# Patient Record
Sex: Female | Born: 1960 | Race: Black or African American | Hispanic: No | Smoking: Never smoker
Health system: Southern US, Community
[De-identification: ages and names within clinical notes are randomized; demographics above are authoritative.]

## PROBLEM LIST (undated history)

## (undated) DIAGNOSIS — M1990 Unspecified osteoarthritis, unspecified site: Secondary | ICD-10-CM

## (undated) DIAGNOSIS — I10 Essential (primary) hypertension: Secondary | ICD-10-CM

## (undated) DIAGNOSIS — R51 Headache: Secondary | ICD-10-CM

## (undated) HISTORY — PX: TUBAL LIGATION: SHX77

## (undated) HISTORY — PX: BUNIONECTOMY: SHX129

## (undated) HISTORY — PX: WISDOM TOOTH EXTRACTION: SHX21

## (undated) HISTORY — PX: LOWER EXTREMITY ANGIOGRAM: SHX5955

## (undated) HISTORY — PX: COLONOSCOPY: SHX174

## (undated) HISTORY — DX: Essential (primary) hypertension: I10

## (undated) HISTORY — PX: POLYPECTOMY: SHX149

## (undated) HISTORY — DX: Headache: R51

---

## 1998-04-12 ENCOUNTER — Encounter: Payer: Self-pay | Admitting: Emergency Medicine

## 1998-04-12 ENCOUNTER — Emergency Department (HOSPITAL_COMMUNITY): Admission: EM | Admit: 1998-04-12 | Discharge: 1998-04-12 | Payer: Self-pay | Admitting: Emergency Medicine

## 1999-09-20 ENCOUNTER — Emergency Department (HOSPITAL_COMMUNITY): Admission: EM | Admit: 1999-09-20 | Discharge: 1999-09-20 | Payer: Self-pay | Admitting: Emergency Medicine

## 2001-05-06 ENCOUNTER — Ambulatory Visit (HOSPITAL_COMMUNITY): Admission: RE | Admit: 2001-05-06 | Discharge: 2001-05-06 | Payer: Self-pay | Admitting: Family Medicine

## 2001-05-06 ENCOUNTER — Encounter: Payer: Self-pay | Admitting: Family Medicine

## 2002-08-19 ENCOUNTER — Other Ambulatory Visit: Admission: RE | Admit: 2002-08-19 | Discharge: 2002-08-19 | Payer: Self-pay | Admitting: *Deleted

## 2003-12-14 ENCOUNTER — Ambulatory Visit (HOSPITAL_COMMUNITY): Admission: RE | Admit: 2003-12-14 | Discharge: 2003-12-14 | Payer: Self-pay | Admitting: Obstetrics & Gynecology

## 2005-12-04 ENCOUNTER — Other Ambulatory Visit: Admission: RE | Admit: 2005-12-04 | Discharge: 2005-12-04 | Payer: Self-pay | Admitting: Obstetrics and Gynecology

## 2006-03-20 ENCOUNTER — Emergency Department (HOSPITAL_COMMUNITY): Admission: EM | Admit: 2006-03-20 | Discharge: 2006-03-20 | Payer: Self-pay | Admitting: Family Medicine

## 2006-04-29 ENCOUNTER — Ambulatory Visit (HOSPITAL_COMMUNITY): Admission: RE | Admit: 2006-04-29 | Discharge: 2006-04-29 | Payer: Self-pay | Admitting: Otolaryngology

## 2007-03-05 ENCOUNTER — Ambulatory Visit: Payer: Self-pay | Admitting: Internal Medicine

## 2007-03-05 DIAGNOSIS — Z9189 Other specified personal risk factors, not elsewhere classified: Secondary | ICD-10-CM

## 2007-03-05 DIAGNOSIS — H9319 Tinnitus, unspecified ear: Secondary | ICD-10-CM | POA: Insufficient documentation

## 2007-03-05 DIAGNOSIS — R079 Chest pain, unspecified: Secondary | ICD-10-CM

## 2007-03-05 DIAGNOSIS — R51 Headache: Secondary | ICD-10-CM

## 2007-03-05 DIAGNOSIS — R519 Headache, unspecified: Secondary | ICD-10-CM | POA: Insufficient documentation

## 2007-03-06 ENCOUNTER — Telehealth: Payer: Self-pay | Admitting: Internal Medicine

## 2007-06-18 ENCOUNTER — Emergency Department (HOSPITAL_COMMUNITY): Admission: EM | Admit: 2007-06-18 | Discharge: 2007-06-18 | Payer: Self-pay | Admitting: Emergency Medicine

## 2007-08-27 DIAGNOSIS — K219 Gastro-esophageal reflux disease without esophagitis: Secondary | ICD-10-CM

## 2007-08-27 DIAGNOSIS — K319 Disease of stomach and duodenum, unspecified: Secondary | ICD-10-CM | POA: Insufficient documentation

## 2007-08-27 DIAGNOSIS — K449 Diaphragmatic hernia without obstruction or gangrene: Secondary | ICD-10-CM | POA: Insufficient documentation

## 2007-08-27 DIAGNOSIS — K649 Unspecified hemorrhoids: Secondary | ICD-10-CM | POA: Insufficient documentation

## 2007-10-12 ENCOUNTER — Ambulatory Visit: Payer: Self-pay | Admitting: Gastroenterology

## 2007-12-30 ENCOUNTER — Ambulatory Visit: Payer: Self-pay | Admitting: Gastroenterology

## 2007-12-30 DIAGNOSIS — K5909 Other constipation: Secondary | ICD-10-CM

## 2007-12-30 DIAGNOSIS — R1031 Right lower quadrant pain: Secondary | ICD-10-CM | POA: Insufficient documentation

## 2008-01-06 ENCOUNTER — Ambulatory Visit (HOSPITAL_COMMUNITY): Admission: RE | Admit: 2008-01-06 | Discharge: 2008-01-06 | Payer: Self-pay | Admitting: Gastroenterology

## 2008-01-06 ENCOUNTER — Ambulatory Visit: Payer: Self-pay | Admitting: Gastroenterology

## 2008-01-22 LAB — HM MAMMOGRAPHY: HM Mammogram: NORMAL

## 2008-07-19 ENCOUNTER — Ambulatory Visit: Payer: Self-pay | Admitting: Internal Medicine

## 2008-07-19 DIAGNOSIS — M542 Cervicalgia: Secondary | ICD-10-CM | POA: Insufficient documentation

## 2008-08-15 ENCOUNTER — Telehealth (INDEPENDENT_AMBULATORY_CARE_PROVIDER_SITE_OTHER): Payer: Self-pay | Admitting: *Deleted

## 2008-08-16 ENCOUNTER — Encounter: Payer: Self-pay | Admitting: Internal Medicine

## 2008-08-31 ENCOUNTER — Telehealth: Payer: Self-pay | Admitting: Internal Medicine

## 2009-01-27 ENCOUNTER — Ambulatory Visit (HOSPITAL_BASED_OUTPATIENT_CLINIC_OR_DEPARTMENT_OTHER): Admission: RE | Admit: 2009-01-27 | Discharge: 2009-01-27 | Payer: Self-pay | Admitting: Internal Medicine

## 2009-01-27 ENCOUNTER — Ambulatory Visit: Payer: Self-pay | Admitting: Diagnostic Radiology

## 2009-01-27 ENCOUNTER — Telehealth: Payer: Self-pay | Admitting: Family

## 2009-01-27 ENCOUNTER — Ambulatory Visit: Payer: Self-pay | Admitting: Family

## 2009-01-27 DIAGNOSIS — M5380 Other specified dorsopathies, site unspecified: Secondary | ICD-10-CM | POA: Insufficient documentation

## 2009-01-27 DIAGNOSIS — R1084 Generalized abdominal pain: Secondary | ICD-10-CM | POA: Insufficient documentation

## 2009-01-27 LAB — CONVERTED CEMR LAB
Bilirubin Urine: NEGATIVE
Glucose, Urine, Semiquant: NEGATIVE
Ketones, urine, test strip: NEGATIVE
Nitrite: NEGATIVE
Protein, U semiquant: NEGATIVE
Specific Gravity, Urine: 1.01
Urobilinogen, UA: 0.2
WBC Urine, dipstick: NEGATIVE
pH: 7.5

## 2009-02-01 ENCOUNTER — Ambulatory Visit: Payer: Self-pay | Admitting: Internal Medicine

## 2009-02-03 ENCOUNTER — Telehealth (INDEPENDENT_AMBULATORY_CARE_PROVIDER_SITE_OTHER): Payer: Self-pay | Admitting: *Deleted

## 2009-02-06 ENCOUNTER — Encounter (INDEPENDENT_AMBULATORY_CARE_PROVIDER_SITE_OTHER): Payer: Self-pay | Admitting: *Deleted

## 2010-02-18 LAB — CONVERTED CEMR LAB
ALT: 24 units/L (ref 0–35)
Basophils Relative: 0.5 % (ref 0.0–1.0)
Bilirubin, Direct: 0.1 mg/dL (ref 0.0–0.3)
CO2: 26 meq/L (ref 19–32)
Calcium: 9.4 mg/dL (ref 8.4–10.5)
Creatinine, Ser: 0.9 mg/dL (ref 0.4–1.2)
Eosinophils Relative: 1.9 % (ref 0.0–5.0)
GFR calc Af Amer: 87 mL/min
Glucose, Bld: 89 mg/dL (ref 70–99)
Hemoglobin: 13 g/dL (ref 12.0–15.0)
Lymphocytes Relative: 34.1 % (ref 12.0–46.0)
Monocytes Absolute: 0.5 10*3/uL (ref 0.2–0.7)
Neutro Abs: 3.8 10*3/uL (ref 1.4–7.7)
Potassium: 4.3 meq/L (ref 3.5–5.1)
RDW: 12 % (ref 11.5–14.6)
Total Bilirubin: 0.6 mg/dL (ref 0.3–1.2)
Total Protein: 6.8 g/dL (ref 6.0–8.3)
VLDL: 11 mg/dL (ref 0–40)
WBC: 6.7 10*3/uL (ref 4.5–10.5)

## 2010-02-22 NOTE — Letter (Signed)
Summary: Primary Care Consult Scheduled Letter  [facility] [place]  [address] [place], [place] [address]   Phone: [phone]  Fax: [phone]      02/06/2009 MRN: [id number]  Sandhya Sabater-NEAL [address] [place], [place]  [address]    Dear Ms. Barretto-NEAL,    We have scheduled an appointment for you.  At the recommendation of Sandford Craze, FNP, we have scheduled you a consult with Dr. Melvia Heaps with [facility] on 03-01-2009 at 9:45am.  Their address is [address], [address] floor, [place] [address]. The office phone number is [phone].  If this appointment day and time is not convenient for you, please feel free to call the office of the doctor you are being referred to at the number listed above and reschedule the appointment.    It is important for you to keep your scheduled appointments. We are here to make sure you are given good patient care.   Thank you,    Tanique, Patient Care Coordinator [facility]

## 2010-02-22 NOTE — Progress Notes (Signed)
Summary: ct results and referral  Phone Note Outgoing Call   Call placed by: Doristine Devoid,  February 03, 2009 9:08 AM Call placed to: Patient Summary of Call: Please call Patient and let her know that her abdominal CT is negative.  She should follow up if he symptoms do not improve in the next 1-2 weeks.   Follow-up for Phone Call        spoke w/ patient says she is feeling somewhat better still having some pain on her R side but informed patient that otherwise xray was normal.........Marland KitchenDoristine Devoid  February 03, 2009 9:10 AM   Additional Follow-up for Phone Call Additional follow up Details #1::        Please let patient know that I have referred her to GI. Additional Follow-up by: Lemont Fillers FNP,  February 03, 2009 1:08 PM    Additional Follow-up for Phone Call Additional follow up Details #2::    left message on machine .........Marland KitchenDoristine Devoid  February 03, 2009 4:28 PM   spoke w/ patient has GI appt set but says that stomach pain was subsiding.......Marland KitchenDoristine Devoid  February 08, 2009 3:58 PM

## 2010-02-22 NOTE — Progress Notes (Signed)
Summary: xray results  Phone Note Outgoing Call Call back at [phone]   Call placed by: Lemont Fillers FNP,  January 27, 2009 5:30 PM Summary of Call: Left message for patient to return call.   KUB negative for significan constipation.  UA + for blood.  If symptoms persist- may need renal US or CT to rule out stone. Initial call taken by: Lemont Fillers FNP,  January 27, 2009 5:31 PM  Follow-up for Phone Call        spoke w/ patient says she is still w/ abdominal cramping and is concerned about stomach cancer due to family hx so would you like to do CT?...Marland KitchenMarland KitchenDoristine Devoid  January 30, 2009 9:22 AM   Additional Follow-up for Phone Call Additional follow up Details #1::        Called patient- notes that she continues to have LLQ pain, denies fever, denies palpable tenderness in the left, but + tenderness in the right lower quadrant.  Will order CT abdomen and Pelvis to r/o stone, r/o  colitis.  Patient instructed to call if fever over 101 or BRBPR. Additional Follow-up by: Lemont Fillers FNP,  January 30, 2009 9:39 AM

## 2010-02-22 NOTE — Assessment & Plan Note (Signed)
Summary: ALOT OF PAIN IN THE STOMACH//PH   Vital Signs:  Patient profile:   50 year old female Weight:      216.8 pounds Temp:     98.4 degrees F oral BP sitting:   130 / 80  (left arm) CC: some Lower abdominal pain x2 wks  Comments -nausea at nighttime -stomach feels full  -sharp pain running along back    Primary Care Provider:  Willow Ora, MD  CC:  some Lower abdominal pain x2 wks .  History of Present Illness: Lacey Green is a 50 year old female who presents with c/o initially lower abdominal tenderness in low abdomen before the holidays- this was associated with lower back pain.  Lower abdominal pain has resolved, but still having low back pain at times.  Now notes + nausea, occasional stabbing abdominal pain which radiates around her rib cage.  These symptoms have been on for 1 week.    She noted mold on her filter in her home and is concerned that this may be contributing to GI symptoms.  LMP.  Allergies: 1)  ! Pcn  Review of Systems       Denies fever, denies diarrhea- + constipation took natural laxative-  +BM this AM, noted small amount of blood on tissue today after straining, + hx of hemorrhoids   Impression & Recommendations:  Problem # 1:  ABDOMINAL PAIN, GENERALIZED (ICD-789.07) Assessment New I suspect patient has constipation- will check KUB, plan to order miralax daily if confirmed. Orders: KUB (KUB)  Problem # 2:  MUSCLE SPASM, LUMBAR REGION (ICD-724.8) Assessment: New Will give a trial of NSAID, flexeril.  Pt instructed to follow up if these symptoms do not improve. Her updated medication list for this problem includes:    Flexeril 10 Mg Tab (Cyclobenzaprine hcl) .Marland Kitchen... Take one tablet by mouth three times a day    Mobic 7.5 Mg Tabs (Meloxicam) ..... One tablet by mouth daily prn  Complete Medication List: 1)  Flexeril 10 Mg Tab (Cyclobenzaprine hcl) .... Take one tablet by mouth three times a day 2)  Mobic 7.5 Mg Tabs (Meloxicam) .... One tablet  by mouth daily prn  Patient Instructions: 1)  Complete your x-ray today- I will call with the results and further instructions. 2)  Call if worsening abdominal pain or fever. 3)  Call for follow up if back/pain spasm does not improve. Prescriptions: MOBIC 7.5 MG TABS (MELOXICAM) one tablet by mouth daily PRN  #20 x 0   Entered and Authorized by:   Lemont Fillers FNP   Signed by:   Lemont Fillers FNP on 01/27/2009   Method used:   Electronically to        [facility] [phone]* (retail)       [address]       [place], [place]  [address]       Ph: [phone]       Fax: [phone]   RxID:   [id number] FLEXERIL 10 MG TAB (CYCLOBENZAPRINE HCL) Take one tablet by mouth three times a day  #30 x 0   Entered and Authorized by:   Lemont Fillers FNP   Signed by:   Lemont Fillers FNP on 01/27/2009   Method used:   Electronically to        [facility]  [id number]* (retail)       [address]  [place], [place]  [address]       Ph: [phone]       Fax: [phone]   RxID:   [id number]   Laboratory Results   Urine Tests    Routine Urinalysis   Glucose: negative   (Normal Range: Negative) Bilirubin: negative   (Normal Range: Negative) Ketone: negative   (Normal Range: Negative) Spec. Gravity: 1.010   (Normal Range: 1.003-1.035) Blood: large   (Normal Range: Negative) pH: 7.5   (Normal Range: 5.0-8.0) Protein: negative   (Normal Range: Negative) Urobilinogen: 0.2   (Normal Range: 0-1) Nitrite: negative   (Normal Range: Negative) Leukocyte Esterace: negative   (Normal Range: Negative)    Comments: patient on menses.Marland KitchenMarland KitchenDoristine Devoid  January 27, 2009 11:23 AM     Appended Document: ALOT OF PAIN IN THE STOMACH//PH    Clinical Lists Changes  Orders: Added new Service order of UA Dipstick w/o Micro (manual) ([id number]) - Signed      Appended Document: ALOT OF PAIN IN THE  STOMACH//PH gen: awake, alert, NAD cv: s1/s2, RRR resp: BS CTA Bilaterally abd: soft, non-tender, non-distended, normoactive BS

## 2010-04-24 ENCOUNTER — Encounter: Payer: Self-pay | Admitting: Family

## 2010-04-25 ENCOUNTER — Ambulatory Visit (INDEPENDENT_AMBULATORY_CARE_PROVIDER_SITE_OTHER): Payer: PRIVATE HEALTH INSURANCE | Admitting: Family

## 2010-04-25 ENCOUNTER — Other Ambulatory Visit: Payer: Self-pay | Admitting: Family

## 2010-04-25 ENCOUNTER — Other Ambulatory Visit (HOSPITAL_COMMUNITY)
Admission: RE | Admit: 2010-04-25 | Discharge: 2010-04-25 | Disposition: A | Discharge status: Home / Self Care | Payer: PRIVATE HEALTH INSURANCE | Source: Ambulatory Visit | Attending: Family | Admitting: Family

## 2010-04-25 ENCOUNTER — Encounter: Payer: Self-pay | Admitting: Family

## 2010-04-25 DIAGNOSIS — Z136 Encounter for screening for cardiovascular disorders: Secondary | ICD-10-CM

## 2010-04-25 DIAGNOSIS — Z0000 Encounter for general adult medical examination without abnormal findings: Secondary | ICD-10-CM

## 2010-04-25 DIAGNOSIS — M542 Cervicalgia: Secondary | ICD-10-CM

## 2010-04-25 DIAGNOSIS — Z01419 Encounter for gynecological examination (general) (routine) without abnormal findings: Secondary | ICD-10-CM

## 2010-04-25 DIAGNOSIS — R110 Nausea: Secondary | ICD-10-CM | POA: Insufficient documentation

## 2010-04-25 DIAGNOSIS — Z23 Encounter for immunization: Secondary | ICD-10-CM

## 2010-04-25 DIAGNOSIS — R319 Hematuria, unspecified: Secondary | ICD-10-CM

## 2010-04-25 NOTE — Assessment & Plan Note (Signed)
Recommended that she start PPI.  She declines.  Will check LFT's lipase and abdominal ultrasound.

## 2010-04-25 NOTE — Assessment & Plan Note (Signed)
Check x-ray of the c-spine.

## 2010-04-25 NOTE — Progress Notes (Signed)
  Subjective:    Patient ID: Lacey Green, female    DOB: [birth year], 50 y.o.   MRN: [id number]  HPI Lacey Green is a 50 yr old female who presents today for CPX.  Preventative- Diet- some fast food, trying to watch the fat in her diet.  4sodas/week, 2 teas/week.   Exercise-  Weight training/exercise equiptment at home 3days/week 30 minutes.  Walks 3x a week on treadmill. Weight gain- has gained 19 pounds since the fall. Tetanus was august 2002. Pap 2010- normal, mammogram 2010 Periods reg.  LMP 04/17/10  Nausea- denies associated pain,  Pain is worst on an empty stomach.   Uses icey hot to help the epigastric tenderness.  Dull pain in the right arm-    Review of Systems  Constitutional: Positive for unexpected weight change. Negative for fever and chills.  Eyes: Positive for visual disturbance.  Respiratory: Negative for cough and shortness of breath.   Cardiovascular: Positive for palpitations. Negative for chest pain.       Occasional "heart skips a beat."  Gastrointestinal: Positive for nausea.       Chronic nausea  Genitourinary: Positive for hematuria.       + hematuria last month. ? Stone passed.  Had flank pain at that time. + nocturia  Musculoskeletal: Negative for joint swelling.       Some pain on the right side of her neck.  Uses aleve PRN. Occasional leg cramps/toe cramps  Skin: Negative for rash.  Neurological: Positive for numbness.       + numbness right neck, Denies problems with memory  Psychiatric/Behavioral:       Denies anxiety or depression       Objective:   Physical Exam  Constitutional: She is oriented to person, place, and time. She appears well-developed and well-nourished.       Overweight, pleasant AA female, in NAD  HENT:  Head: Normocephalic and atraumatic.  Right Ear: External ear normal.  Left Ear: External ear normal.       R TM was mildly retracted. L TM normal  Eyes: Conjunctivae, EOM and lids are normal. Pupils are  equal, round, and reactive to light. No scleral icterus.  Neck: Neck supple.  Cardiovascular: Normal rate, regular rhythm and normal heart sounds.   Pulmonary/Chest: Effort normal and breath sounds normal. No respiratory distress.  Abdominal: Soft. There is tenderness in the epigastric area.       + tenderness to palpation of the base of the sternum.  Genitourinary: Vagina normal. No breast swelling or tenderness. No tenderness around the vagina. No vaginal discharge found.       Breast exam normal- no palpable masses  Pelvic exam- normal adnexa, no uterine enlargement, no discharge or lesions. Pap smear performed today.   Musculoskeletal:       Some pain in the right shoulder with flexion  Lymphadenopathy:    She has no cervical adenopathy.    She has no axillary adenopathy.       Approximately 1cm right submaxillary LN, nontender and mobile  Neurological: She is alert and oriented to person, place, and time.  Skin: Skin is warm, dry and intact.  Psychiatric: She has a normal mood and affect. Her speech is normal and behavior is normal. Judgment and thought content normal. Cognition and memory are normal.          Assessment & Plan:

## 2010-04-25 NOTE — Assessment & Plan Note (Signed)
Patient counseled on diet and exercise. Pap performed today.  Mammogram to be scheduled.  Tetanus given today.

## 2010-04-25 NOTE — Patient Instructions (Addendum)
Please complete your fasting lab work downstairs. Schedule mammogram, abdominal ultrasound and neck x-ray downstairs. Keep up the good work the exercise.

## 2010-04-26 LAB — HEPATIC FUNCTION PANEL
ALT: 27 U/L (ref 0–35)
AST: 25 U/L (ref 0–37)
Albumin: 4.2 g/dL (ref 3.5–5.2)
Alkaline Phosphatase: 105 U/L (ref 39–117)
Bilirubin, Direct: 0.1 mg/dL (ref 0.0–0.3)
Indirect Bilirubin: 0.2 mg/dL (ref 0.0–0.9)
Total Bilirubin: 0.3 mg/dL (ref 0.3–1.2)
Total Protein: 6.8 g/dL (ref 6.0–8.3)

## 2010-04-26 LAB — URINALYSIS

## 2010-04-26 LAB — BASIC METABOLIC PANEL
BUN: 13 mg/dL (ref 6–23)
CO2: 24 mEq/L (ref 19–32)
Calcium: 9.8 mg/dL (ref 8.4–10.5)
Chloride: 106 mEq/L (ref 96–112)
Creat: 0.93 mg/dL (ref 0.40–1.20)
Glucose, Bld: 87 mg/dL (ref 70–99)

## 2010-04-27 LAB — URINE CULTURE: Organism ID, Bacteria: NO GROWTH

## 2010-04-29 ENCOUNTER — Encounter: Payer: Self-pay | Admitting: Family

## 2010-04-30 ENCOUNTER — Encounter: Payer: Self-pay | Admitting: Family

## 2010-05-02 ENCOUNTER — Ambulatory Visit (HOSPITAL_BASED_OUTPATIENT_CLINIC_OR_DEPARTMENT_OTHER)
Admission: RE | Admit: 2010-05-02 | Discharge: 2010-05-02 | Disposition: A | Discharge status: Home / Self Care | Payer: PRIVATE HEALTH INSURANCE | Source: Ambulatory Visit | Attending: Family | Admitting: Family

## 2010-05-02 ENCOUNTER — Ambulatory Visit (INDEPENDENT_AMBULATORY_CARE_PROVIDER_SITE_OTHER)
Admission: RE | Admit: 2010-05-02 | Discharge: 2010-05-02 | Disposition: A | Discharge status: Home / Self Care | Payer: PRIVATE HEALTH INSURANCE | Source: Ambulatory Visit | Attending: Family | Admitting: Family

## 2010-05-02 ENCOUNTER — Telehealth: Payer: Self-pay | Admitting: Family

## 2010-05-02 DIAGNOSIS — Z1231 Encounter for screening mammogram for malignant neoplasm of breast: Secondary | ICD-10-CM | POA: Insufficient documentation

## 2010-05-02 DIAGNOSIS — R110 Nausea: Secondary | ICD-10-CM

## 2010-05-02 DIAGNOSIS — R209 Unspecified disturbances of skin sensation: Secondary | ICD-10-CM | POA: Insufficient documentation

## 2010-05-02 DIAGNOSIS — M5030 Other cervical disc degeneration, unspecified cervical region: Secondary | ICD-10-CM | POA: Insufficient documentation

## 2010-05-02 DIAGNOSIS — Z0000 Encounter for general adult medical examination without abnormal findings: Secondary | ICD-10-CM

## 2010-05-02 DIAGNOSIS — R1031 Right lower quadrant pain: Secondary | ICD-10-CM | POA: Insufficient documentation

## 2010-05-02 DIAGNOSIS — M542 Cervicalgia: Secondary | ICD-10-CM

## 2010-05-02 NOTE — Telephone Encounter (Signed)
Left message for patient to return my call to discuss C-spine x-ray.

## 2010-05-03 ENCOUNTER — Telehealth: Payer: Self-pay | Admitting: *Deleted

## 2010-05-03 NOTE — Telephone Encounter (Signed)
Pt returned Lacey Green's call re: c-spine xray. Advised pt Lacey Green will call her tomorrow. Pt states she can be reached at [phone] after 3:30 or we can leave a detailed message on her voicemail.

## 2010-05-03 NOTE — Telephone Encounter (Signed)
Pt.notified

## 2010-05-03 NOTE — Telephone Encounter (Signed)
Left message on machine to return my call.  Notes Recorded by Katrinka Blazing. Peggyann Juba, NP on 05/02/2010 at 10:18 AM Pls call pt and let her know that her ultrasound is normal- no evidence of gallbladder disease.

## 2010-05-04 ENCOUNTER — Telehealth: Payer: Self-pay | Admitting: Family

## 2010-05-04 NOTE — Telephone Encounter (Signed)
Please call lab and request add on of lipid panel.  If they are unable to do so- ask pt to come fasting to her apt in may and we will redraw at that time.

## 2010-05-04 NOTE — Telephone Encounter (Signed)
Left detailed message on number listed.  Pt instructed to call us if she has any numbness or weakness in her arms.  Also asked that she come fasting to her next apt so that FLP can be redrawn. Call us back if questions.

## 2010-05-23 ENCOUNTER — Ambulatory Visit: Payer: PRIVATE HEALTH INSURANCE | Admitting: Family

## 2010-10-17 LAB — DIFFERENTIAL
Eosinophils Absolute: 0.1
Eosinophils Relative: 2
Lymphs Abs: 2.1
Monocytes Absolute: 0.6
Monocytes Relative: 9

## 2010-10-17 LAB — POCT I-STAT, CHEM 8
Calcium, Ion: 1.13
Creatinine, Ser: 0.8
Glucose, Bld: 89
Hemoglobin: 12.9
Sodium: 140
TCO2: 19

## 2010-10-17 LAB — URINALYSIS, ROUTINE W REFLEX MICROSCOPIC
Glucose, UA: NEGATIVE
Hgb urine dipstick: NEGATIVE
Protein, ur: NEGATIVE

## 2010-10-17 LAB — CBC
HCT: 37.6
Hemoglobin: 13.3
MCV: 90.1
Platelets: 187
WBC: 6.7

## 2010-12-17 ENCOUNTER — Telehealth: Payer: Self-pay | Admitting: Family

## 2010-12-17 NOTE — Telephone Encounter (Signed)
Pt had called back before report could be faxed and stated that the fax number was incorrect. Pt states she will pick up the copy. Report left at front desk for pick up and pt is aware.

## 2010-12-17 NOTE — Telephone Encounter (Signed)
Patient needs copy of Tdap     And also stated she did not get her lab results

## 2010-12-17 NOTE — Telephone Encounter (Signed)
Spoke with pt, she states she never received her lab letters from April. Gave verbal to pt and advised her to call us in the future if she does not hear from results within a couple of weeks. Pt asks that I fax immunization report to [phone]. Report faxed.

## 2010-12-17 NOTE — Telephone Encounter (Signed)
Left message on machine to return my call. 

## 2012-02-26 ENCOUNTER — Telehealth: Payer: Self-pay | Admitting: Internal Medicine

## 2012-02-26 NOTE — Telephone Encounter (Signed)
Attempted to reach pt and left detailed message on cell# that she can sign a records release at our office or at her new primary care office to request records and to call if any questions.

## 2012-02-26 NOTE — Telephone Encounter (Signed)
Patient left message on nurse voicemail stating that she has questions regarding her last scan that was done in 2012? She states that she has moved and her new primary may want the results from this scan.

## 2012-07-22 ENCOUNTER — Telehealth: Payer: Self-pay | Admitting: *Deleted

## 2012-07-22 NOTE — Telephone Encounter (Signed)
Received message from pt on 07/21/12 wanting to know the last immunization she received from [place]. Advised pt per our records that she was given Tdap on 04/25/10 and we have no record of any other immunizations on file.

## 2013-11-22 ENCOUNTER — Encounter: Payer: Self-pay | Admitting: Family

## 2014-02-07 ENCOUNTER — Other Ambulatory Visit: Payer: Self-pay | Admitting: Obstetrics and Gynecology

## 2014-02-07 DIAGNOSIS — N644 Mastodynia: Secondary | ICD-10-CM

## 2014-02-08 ENCOUNTER — Other Ambulatory Visit: Payer: Self-pay | Admitting: Obstetrics and Gynecology

## 2014-02-08 ENCOUNTER — Other Ambulatory Visit: Payer: Self-pay

## 2014-02-08 DIAGNOSIS — N644 Mastodynia: Secondary | ICD-10-CM

## 2014-02-08 LAB — CYTOLOGY - PAP

## 2014-02-15 ENCOUNTER — Encounter (HOSPITAL_BASED_OUTPATIENT_CLINIC_OR_DEPARTMENT_OTHER): Payer: Self-pay | Admitting: *Deleted

## 2014-02-15 ENCOUNTER — Ambulatory Visit
Admission: RE | Admit: 2014-02-15 | Discharge: 2014-02-15 | Disposition: A | Discharge status: Home / Self Care | Payer: BC Managed Care – PPO | Source: Ambulatory Visit | Attending: Obstetrics and Gynecology | Admitting: Obstetrics and Gynecology

## 2014-02-15 ENCOUNTER — Emergency Department (HOSPITAL_BASED_OUTPATIENT_CLINIC_OR_DEPARTMENT_OTHER)
Admission: EM | Admit: 2014-02-15 | Discharge: 2014-02-15 | Disposition: A | Discharge status: Home / Self Care | Payer: BC Managed Care – PPO | Attending: Emergency Medicine | Admitting: Emergency Medicine

## 2014-02-15 ENCOUNTER — Emergency Department (HOSPITAL_BASED_OUTPATIENT_CLINIC_OR_DEPARTMENT_OTHER): Payer: BC Managed Care – PPO

## 2014-02-15 DIAGNOSIS — Z880 Allergy status to penicillin: Secondary | ICD-10-CM | POA: Insufficient documentation

## 2014-02-15 DIAGNOSIS — R55 Syncope and collapse: Secondary | ICD-10-CM | POA: Diagnosis not present

## 2014-02-15 DIAGNOSIS — N644 Mastodynia: Secondary | ICD-10-CM

## 2014-02-15 DIAGNOSIS — G4489 Other headache syndrome: Secondary | ICD-10-CM | POA: Diagnosis not present

## 2014-02-15 DIAGNOSIS — R51 Headache: Secondary | ICD-10-CM | POA: Diagnosis present

## 2014-02-15 LAB — BASIC METABOLIC PANEL
ANION GAP: 2 — AB (ref 5–15)
BUN: 11 mg/dL (ref 6–23)
CALCIUM: 9.3 mg/dL (ref 8.4–10.5)
CHLORIDE: 110 mmol/L (ref 96–112)
CO2: 28 mmol/L (ref 19–32)
Creatinine, Ser: 0.93 mg/dL (ref 0.50–1.10)
GFR, EST AFRICAN AMERICAN: 80 mL/min — AB (ref >90)
GFR, EST NON AFRICAN AMERICAN: 69 mL/min — AB (ref >90)
Glucose, Bld: 111 mg/dL — ABNORMAL HIGH (ref 70–99)
POTASSIUM: 3.6 mmol/L (ref 3.5–5.1)
Sodium: 140 mmol/L (ref 135–145)

## 2014-02-15 LAB — CBC WITH DIFFERENTIAL/PLATELET
BASOS ABS: 0 10*3/uL (ref 0.0–0.1)
BASOS PCT: 0 % (ref 0–1)
Band Neutrophils: 1 % (ref 0–10)
Eosinophils Absolute: 0.2 10*3/uL (ref 0.0–0.7)
Eosinophils Relative: 4 % (ref 0–5)
HEMATOCRIT: 40.4 % (ref 36.0–46.0)
Hemoglobin: 13.5 g/dL (ref 12.0–15.0)
LYMPHS ABS: 3 10*3/uL (ref 0.7–4.0)
LYMPHS PCT: 58 % — AB (ref 12–46)
MCH: 28.6 pg (ref 26.0–34.0)
MCHC: 33.4 g/dL (ref 30.0–36.0)
MCV: 85.6 fL (ref 78.0–100.0)
MONOS PCT: 5 % (ref 3–12)
Monocytes Absolute: 0.3 10*3/uL (ref 0.1–1.0)
NEUTROS PCT: 32 % — AB (ref 43–77)
Neutro Abs: 1.7 10*3/uL (ref 1.7–7.7)
Platelets: 167 10*3/uL (ref 150–400)
RBC: 4.72 MIL/uL (ref 3.87–5.11)
RDW: 14.2 % (ref 11.5–15.5)
WBC: 5.2 10*3/uL (ref 4.0–10.5)

## 2014-02-15 LAB — SEDIMENTATION RATE: Sed Rate: 9 mm/hr (ref 0–22)

## 2014-02-15 NOTE — ED Notes (Signed)
Pt alert x4 respirations easy non labored.  

## 2014-02-15 NOTE — ED Provider Notes (Signed)
CSN: [id number]     Arrival date & time 02/15/14  1027 History   First MD Initiated Contact with Patient 02/15/14 1118     Chief Complaint  Patient presents with  . Headache   Patient is a 54 y.o. female presenting with headaches. The history is provided by the patient.  Headache Pain location:  R temporal Quality:  Sharp Onset quality:  Gradual Duration: 5 months. Timing:  Constant Progression:  Unchanged Chronicity:  Chronic Relieved by:  None tried Exacerbated by: palpation. Associated symptoms: near-syncope   Associated symptoms: no blurred vision, no fever, no vomiting and no weakness   Patient reports chronic daily right temporal HA for 5 months - she has the pain 24 hours a day, 7 days a week.  She denies focal weakness. No fever.  No head trauma.   She reports once last week and once today she had brief episode of feeling faint but denies LOC No cp/sob  She has PCP but has not seen them for this issue Denies h/o CVA/CAD  She googled her symptoms and she thinks she has temporal arteritis  Past Medical History  Diagnosis Date  . CZYSAYTK(160.1)    Past Surgical History  Procedure Laterality Date  . Tubal ligation    . Breast lumpectomy    . Dilation and curettage of uterus    . Wisdom tooth extraction     Family History  Problem Relation Age of Onset  . Hypertension Mother   . Colon polyps Mother   . Diabetes Father   . Cancer Father     prostate  . Cancer Maternal Aunt     ovarian  . Cancer Paternal Uncle     stomach  . Hypertension Maternal Grandmother   . Arthritis Maternal Grandmother   . Diabetes Maternal Grandfather   . Heart disease Paternal Grandfather     MI  . Cancer Paternal Grandmother     breast; left mastectomy   History  Substance Use Topics  . Smoking status: Never Smoker   . Smokeless tobacco: Not on file  . Alcohol Use: Yes     Comment: occasional   OB History    Gravida Para Term Preterm AB TAB SAB Ectopic Multiple Living   7  3   4           Review of Systems  Constitutional: Negative for fever.  Eyes: Negative for blurred vision.  Respiratory: Negative for shortness of breath.   Cardiovascular: Positive for near-syncope. Negative for chest pain.  Gastrointestinal: Negative for vomiting.  Neurological: Positive for headaches. Negative for weakness.  All other systems reviewed and are negative.     Allergies  Penicillins  Home Medications   Prior to Admission medications   Medication Sig Start Date End Date Taking? Authorizing Provider  cyclobenzaprine (FLEXERIL) 10 MG tablet Take 10 mg by mouth 3 (three) times daily as needed.      Historical Provider, MD  meloxicam (MOBIC) 7.5 MG tablet Take 7.5 mg by mouth daily as needed.      Historical Provider, MD   BP 100/68 mmHg  Pulse 71  Temp(Src) 98.1 F (36.7 C) (Oral)  Resp 16  Ht 5\' 2"  (1.575 m)  Wt 222 lb (100.699 kg)  BMI 40.59 kg/m2  SpO2 100%  LMP 12/16/2013 Physical Exam CONSTITUTIONAL: Well developed/well nourished HEAD: Normocephalic/atraumatic, mild tenderness to right temporal region EYES: EOMI/PERRL, no nystagmus, no ptosis ENMT: Mucous membranes moist NECK: supple no meningeal signs, no bruits SPINE/BACK:entire spine  nontender CV: S1/S2 noted, no murmurs/rubs/gallops noted LUNGS: Lungs are clear to auscultation bilaterally, no apparent distress ABDOMEN: soft, nontender, no rebound or guarding NEURO:Awake/alert, facies symmetric, no arm or leg drift is noted Equal 5/5 strength with shoulder abduction, elbow flex/extension, wrist flex/extension in upper extremities and equal hand grips bilaterally Equal 5/5 strength with hip flexion,knee flex/extension, foot dorsi/plantar flexion Cranial nerves 3/4/5/6/07/29/08/11/12 tested and intact Gait normal without ataxia No past pointing Sensation to light touch intact in all extremities EXTREMITIES: pulses normal, full ROM SKIN: warm, color normal PSYCH: no abnormalities of mood noted,  alert and oriented to situation   ED Course  Procedures   Pt with chronic HA without recent neuroimaging CT head negative She has no neuro deficits She also reports having near syncope today, but is ambulatory and no EKG changes Also unlikely to have temporal arteritis with normal sed rate  Labs Review Labs Reviewed  BASIC METABOLIC PANEL - Abnormal; Notable for the following:    Glucose, Bld 111 (*)    GFR calc non Af Amer 69 (*)    GFR calc Af Amer 80 (*)    Anion gap 2 (*)    All other components within normal limits  CBC WITH DIFFERENTIAL/PLATELET - Abnormal; Notable for the following:    Neutrophils Relative % 32 (*)    Lymphocytes Relative 58 (*)    All other components within normal limits  SEDIMENTATION RATE       EKG Interpretation   Date/Time:  Tuesday February 15 2014 11:05:24 EST Ventricular Rate:  81 PR Interval:  158 QRS Duration: 84 QT Interval:  384 QTC Calculation: 446 R Axis:   19 Text Interpretation:  Normal sinus rhythm Normal ECG No significant change  since last tracing Confirmed by Christy Gentles  MD, Elenore Rota ([id number]) on 02/15/2014  11:19:25 AM      MDM   Final diagnoses:  Other headache syndrome  Near syncope    Nursing notes including past medical history and social history reviewed and considered in documentation Labs/vital reviewed myself and considered during evaluation     Sharyon Cable, MD 02/15/14 1413

## 2014-02-15 NOTE — ED Notes (Signed)
Intermittant Right sided h/a since November. Nausea. Has felt faint at times. States at times it is hard to focus with right eye when h/a starts.

## 2014-02-15 NOTE — Discharge Instructions (Signed)

## 2014-03-20 ENCOUNTER — Ambulatory Visit (INDEPENDENT_AMBULATORY_CARE_PROVIDER_SITE_OTHER): Payer: BLUE CROSS/BLUE SHIELD | Admitting: Physician Assistant

## 2014-03-20 VITALS — BP 144/88 | HR 90 | Temp 98.0°F | Resp 17 | Ht 64.5 in | Wt 218.0 lb

## 2014-03-20 DIAGNOSIS — R0981 Nasal congestion: Secondary | ICD-10-CM

## 2014-03-20 DIAGNOSIS — H109 Unspecified conjunctivitis: Secondary | ICD-10-CM

## 2014-03-20 MED ORDER — IPRATROPIUM BROMIDE 0.03 % NA SOLN: 2.0000 | Freq: Two times a day (BID) | NASAL | Status: DC

## 2014-03-20 NOTE — Progress Notes (Signed)
Subjective:    Patient ID: Lacey Green, female    DOB: 1960/09/14, 54 y.o.   MRN: [id number]  HPI  This is a 54 year old female presenting with red right eye x 5 days. Today she woke with a red left eye. + itching and slight upper eyelid swelling. She wakes with crusting on eyelid. Some slight watery discharge throughout the day, no purulent discharge. She denies blurred vision or eye pain. She does not wear contacts. She states she has had some nasal congestion x 1 month that she wakes with in the mornings. She also has some slight right sided sore throat in the mornings. These symptoms both improve throughout the day. She has tried allergy eye drops for her eyes and not helping. She denies otalgia, sinus pressure or cough.   Review of Systems  Constitutional: Negative for fever and chills.  HENT: Positive for congestion and sore throat. Negative for ear pain and sinus pressure.   Eyes: Positive for discharge, redness and itching. Negative for pain and visual disturbance.  Respiratory: Negative for cough.   Gastrointestinal: Negative for nausea and vomiting.  Skin: Negative for rash.  Allergic/Immunologic: Negative for environmental allergies.  Hematological: Negative for adenopathy.   Patient Active Problem List   Diagnosis Date Noted  . NECK PAIN, CHRONIC 07/19/2008  . OTHER CONSTIPATION 12/30/2007  . HEMORRHOIDS 08/27/2007  . GERD 08/27/2007  . PEPTIC STRICTURE 08/27/2007  . HIATAL HERNIA 08/27/2007  . 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 HEADACHE 03/05/2007  . CHEST PAIN 03/05/2007  . COLPOSCOPY, HX OF 03/05/2007    Prior to Admission medications   Not on File   Allergies  Allergen Reactions  . Penicillins    Patient's social and family history were reviewed.     Objective:   Physical Exam  Constitutional: She is oriented to person, place, and time. She appears well-developed and well-nourished. No distress.  HENT:  Head: Normocephalic and atraumatic.  Right  Ear: Hearing, tympanic membrane, external ear and ear canal normal.  Left Ear: Hearing, external ear and ear canal normal. Tympanic membrane is retracted.  Nose: Mucosal edema present. Right sinus exhibits no maxillary sinus tenderness and no frontal sinus tenderness. Left sinus exhibits no maxillary sinus tenderness and no frontal sinus tenderness.  Mouth/Throat: Uvula is midline and mucous membranes are normal. Posterior oropharyngeal erythema present. No oropharyngeal exudate or posterior oropharyngeal edema.  Dry nasal mucosa with scabbing  Eyes: EOM and lids are normal. Pupils are equal, round, and reactive to light. Right eye exhibits no discharge. No foreign body present in the right eye. Left eye exhibits no discharge. No foreign body present in the left eye. Right conjunctiva is injected. Left conjunctiva is injected. No scleral icterus.  Visual Acuity in Right Eye - Without correction: 20/20  With correction:  Visual Acuity in Left Eye - Without correction: 20/20  With correction:  Visual Acuity in Both Eyes - Without correction: 20/20  With correction:     Cardiovascular: Normal rate, regular rhythm, normal heart sounds, intact distal pulses and normal pulses.   No murmur heard. Pulmonary/Chest: Effort normal and breath sounds normal. No respiratory distress. She has no wheezes. She has no rhonchi. She has no rales.  Musculoskeletal: Normal range of motion.  Lymphadenopathy:  Small shotty bilateral anterior lymphadenopathy  Neurological: She is alert and oriented to person, place, and time.  Skin: Skin is warm, dry and intact. No lesion and no rash noted.  Psychiatric: She has a normal mood  and affect. Her speech is normal and behavior is normal. Thought content normal.   BP 144/88 mmHg  Pulse 90  Temp(Src) 98 F (36.7 C) (Oral)  Resp 17  Ht 5' 4.5" (1.638 m)  Wt 218 lb (98.884 kg)  BMI 36.86 kg/m2  SpO2 97%  LMP 12/16/2013     Assessment & Plan:  1. Nasal congestion 2.  Bilateral conjunctivitis Conjunctivitis is likely viral. Counseled on hand hygiene. She will stop using allergy eye drops as these are likely irritating. She start using wetting drops and apply warm compresses. Prescribed atrovent for nasal congestion. She will return in 1 week if symptoms are not improving.   - ipratropium (ATROVENT) 0.03 % nasal spray; Place 2 sprays into both nostrils 2 (two) times daily.  Dispense: 30 mL; Refill: 0   Lacey Green, MHS [facility]  03/20/2014

## 2014-03-20 NOTE — Patient Instructions (Signed)
Use nasal spray twice a day for congestion until better. Use wetting drops for your eyes - can decrease irritation. STOP using allergy eye drops. Return in 1 week if symptoms are not improving.

## 2014-11-23 ENCOUNTER — Emergency Department (HOSPITAL_BASED_OUTPATIENT_CLINIC_OR_DEPARTMENT_OTHER)
Admission: EM | Admit: 2014-11-23 | Discharge: 2014-11-23 | Disposition: A | Discharge status: Home / Self Care | Payer: BC Managed Care – PPO | Attending: Emergency Medicine | Admitting: Emergency Medicine

## 2014-11-23 ENCOUNTER — Encounter (HOSPITAL_BASED_OUTPATIENT_CLINIC_OR_DEPARTMENT_OTHER): Payer: Self-pay | Admitting: Emergency Medicine

## 2014-11-23 ENCOUNTER — Emergency Department (HOSPITAL_BASED_OUTPATIENT_CLINIC_OR_DEPARTMENT_OTHER): Payer: BC Managed Care – PPO

## 2014-11-23 DIAGNOSIS — Z79899 Other long term (current) drug therapy: Secondary | ICD-10-CM | POA: Diagnosis not present

## 2014-11-23 DIAGNOSIS — S93402A Sprain of unspecified ligament of left ankle, initial encounter: Secondary | ICD-10-CM | POA: Insufficient documentation

## 2014-11-23 DIAGNOSIS — Y998 Other external cause status: Secondary | ICD-10-CM | POA: Insufficient documentation

## 2014-11-23 DIAGNOSIS — Y9289 Other specified places as the place of occurrence of the external cause: Secondary | ICD-10-CM | POA: Diagnosis not present

## 2014-11-23 DIAGNOSIS — Y9389 Activity, other specified: Secondary | ICD-10-CM | POA: Diagnosis not present

## 2014-11-23 DIAGNOSIS — W108XXA Fall (on) (from) other stairs and steps, initial encounter: Secondary | ICD-10-CM | POA: Diagnosis not present

## 2014-11-23 DIAGNOSIS — Z880 Allergy status to penicillin: Secondary | ICD-10-CM | POA: Diagnosis not present

## 2014-11-23 DIAGNOSIS — S99912A Unspecified injury of left ankle, initial encounter: Secondary | ICD-10-CM | POA: Diagnosis present

## 2014-11-23 DIAGNOSIS — S99922A Unspecified injury of left foot, initial encounter: Secondary | ICD-10-CM | POA: Diagnosis not present

## 2014-11-23 MED ORDER — HYDROCODONE-ACETAMINOPHEN 5-325 MG PO TABS
1.0000 | ORAL_TABLET | Freq: Once | ORAL | Status: DC
  Filled 2014-11-23: qty 1

## 2014-11-23 MED ORDER — HYDROCODONE-ACETAMINOPHEN 5-325 MG PO TABS: 1.0000 | ORAL_TABLET | Freq: Four times a day (QID) | ORAL | Status: DC | PRN

## 2014-11-23 MED ORDER — ACETAMINOPHEN 325 MG PO TABS
650.0000 mg | ORAL_TABLET | Freq: Once | ORAL | Status: AC
  Administered 2014-11-23: 650 mg via ORAL
  Filled 2014-11-23: qty 2

## 2014-11-23 NOTE — ED Notes (Signed)
MD at bedside. 

## 2014-11-23 NOTE — ED Provider Notes (Addendum)
CSN: [id number]     Arrival date & time 11/23/14  0043 History   First MD Initiated Contact with Patient 11/23/14 0121     Chief Complaint  Patient presents with  . Foot Pain     (Consider location/radiation/quality/duration/timing/severity/associated sxs/prior Treatment) Patient is a 54 y.o. female presenting with lower extremity pain. The history is provided by the patient.  Foot Pain This is a new (walking down the steps and missed the last step twisting her ankle and falling on teh foot) problem. The current episode started 3 to 5 hours ago. The problem occurs constantly. The problem has been gradually worsening. Associated symptoms comments: Pain and swelling to the outside of the left foot.  Initially could walk on it but now so painful unable to bear weight. The symptoms are aggravated by bending, twisting and walking. The symptoms are relieved by rest and NSAIDs. The treatment provided no relief.    Past Medical History  Diagnosis Date  . [id number](379.0)    Past Surgical History  Procedure Laterality Date  . Wisdom tooth extraction     Family History  Problem Relation Age of Onset  . Hypertension Mother   . Colon polyps Mother   . Diabetes Father   . Cancer Father     prostate  . Cancer Maternal Aunt     ovarian  . Cancer Paternal Uncle     stomach  . Hypertension Maternal Grandmother   . Arthritis Maternal Grandmother   . Diabetes Maternal Grandfather   . Heart disease Paternal Grandfather     MI  . Cancer Paternal Grandmother     breast; left mastectomy   Social History  Substance Use Topics  . Smoking status: Never Smoker   . Smokeless tobacco: None  . Alcohol Use: Yes     Comment: occasional   OB History    Gravida Para Term Preterm AB TAB SAB Ectopic Multiple Living   7 3   4           Review of Systems  All other systems reviewed and are negative.     Allergies  Penicillins  Home Medications   Prior to Admission medications   Medication  Sig Start Date End Date Taking? Authorizing Provider  ipratropium (ATROVENT) 0.03 % nasal spray Place 2 sprays into both nostrils 2 (two) times daily. 03/20/14   Bennett Scrape V, PA-C   BP 151/105 mmHg  Pulse 102  Temp(Src) 98.3 F (36.8 C) (Oral)  Resp 16  Ht 5\' 2"  (1.575 m)  Wt 220 lb (99.791 kg)  BMI 40.23 kg/m2  SpO2 100%  LMP 12/16/2013 Physical Exam  Constitutional: She is oriented to person, place, and time. She appears well-developed and well-nourished. No distress.  HENT:  Head: Normocephalic and atraumatic.  Eyes: EOM are normal. Pupils are equal, round, and reactive to light.  Cardiovascular: Normal rate.   Pulmonary/Chest: Effort normal.  Musculoskeletal:       Left ankle: She exhibits decreased range of motion and swelling. She exhibits no deformity and normal pulse. Tenderness. Lateral malleolus and head of 5th metatarsal tenderness found. No proximal fibula tenderness found. Achilles tendon normal.       Feet:  Neurological: She is alert and oriented to person, place, and time.  Skin: Skin is warm and dry.  Psychiatric: She has a normal mood and affect. Her behavior is normal.  Nursing note and vitals reviewed.   ED Course  Procedures (including critical care time) Labs Review Labs Reviewed -  No data to display  Imaging Review Dg Foot Complete Left  11/23/2014  CLINICAL DATA:  Pain throughout the entire foot after falling last night. EXAM: LEFT FOOT - COMPLETE 3+ VIEW COMPARISON:  None. FINDINGS: Marked hallux valgus. Negative for acute fracture, dislocation or radiopaque foreign body. Mild degenerative changes are present in the midfoot and at the first MTP joint. IMPRESSION: Negative for acute fracture. Electronically Signed   By: Andreas Newport M.D.   On: 11/23/2014 01:34   I have personally reviewed and evaluated these images and lab results as part of my medical decision-making.   EKG Interpretation None      MDM   Final diagnoses:  Left ankle  sprain, initial encounter   patient with a mechanical fall tonight with injury to the left lateral foot and ankle. Initially she was able to bear weight but now unable to walk due to severe pain. Imaging is negative for acute fracture however significant swelling and tenderness over the proximal fifth metatarsal and lateral malleolus concerning for ligamentous strain. She will try naproxen twice a day but was also given additional pain control. Placed in an air splint and given crutches. She has a podiatrist she'll follow-up with.  Blanchie Dessert, MD 11/23/14 7482  Blanchie Dessert, MD 11/23/14 7078

## 2014-11-23 NOTE — Discharge Instructions (Signed)
Ankle Sprain  An ankle sprain is an injury to the strong, fibrous tissues (ligaments) that hold the bones of your ankle joint together.   CAUSES  An ankle sprain is usually caused by a fall or by twisting your ankle. Ankle sprains most commonly occur when you step on the outer edge of your foot, and your ankle turns inward. People who participate in sports are more prone to these types of injuries.   SYMPTOMS    Pain in your ankle. The pain may be present at rest or only when you are trying to stand or walk.   Swelling.   Bruising. Bruising may develop immediately or within 1 to 2 days after your injury.   Difficulty standing or walking, particularly when turning corners or changing directions.  DIAGNOSIS   Your caregiver will ask you details about your injury and perform a physical exam of your ankle to determine if you have an ankle sprain. During the physical exam, your caregiver will press on and apply pressure to specific areas of your foot and ankle. Your caregiver will try to move your ankle in certain ways. An X-ray exam may be done to be sure a bone was not broken or a ligament did not separate from one of the bones in your ankle (avulsion fracture).   TREATMENT   Certain types of braces can help stabilize your ankle. Your caregiver can make a recommendation for this. Your caregiver may recommend the use of medicine for pain. If your sprain is severe, your caregiver may refer you to a surgeon who helps to restore function to parts of your skeletal system (orthopedist) or a physical therapist.  HOME CARE INSTRUCTIONS    Apply ice to your injury for 1-2 days or as directed by your caregiver. Applying ice helps to reduce inflammation and pain.    Put ice in a plastic bag.    Place a towel between your skin and the bag.    Leave the ice on for 15-20 minutes at a time, every 2 hours while you are awake.   Only take over-the-counter or prescription medicines for pain, discomfort, or fever as directed by  your caregiver.   Elevate your injured ankle above the level of your heart as much as possible for 2-3 days.   If your caregiver recommends crutches, use them as instructed. Gradually put weight on the affected ankle. Continue to use crutches or a cane until you can walk without feeling pain in your ankle.   If you have a plaster splint, wear the splint as directed by your caregiver. Do not rest it on anything harder than a pillow for the first 24 hours. Do not put weight on it. Do not get it wet. You may take it off to take a shower or bath.   You may have been given an elastic bandage to wear around your ankle to provide support. If the elastic bandage is too tight (you have numbness or tingling in your foot or your foot becomes cold and blue), adjust the bandage to make it comfortable.   If you have an air splint, you may blow more air into it or let air out to make it more comfortable. You may take your splint off at night and before taking a shower or bath. Wiggle your toes in the splint several times per day to decrease swelling.  SEEK MEDICAL CARE IF:    You have rapidly increasing bruising or swelling.   Your toes feel   extremely cold or you lose feeling in your foot.   Your pain is not relieved with medicine.  SEEK IMMEDIATE MEDICAL CARE IF:   Your toes are numb or blue.   You have severe pain that is increasing.  MAKE SURE YOU:    Understand these instructions.   Will watch your condition.   Will get help right away if you are not doing well or get worse.     This information is not intended to replace advice given to you by your health care provider. Make sure you discuss any questions you have with your health care provider.     Document Released: 01/07/2005 Document Revised: 01/28/2014 Document Reviewed: 01/19/2011  Elsevier Interactive Patient Education 2016 [employer]

## 2014-11-23 NOTE — ED Notes (Signed)
Patient states that she fell off a step and states that she is having pain to her left foot.

## 2017-10-05 ENCOUNTER — Emergency Department (HOSPITAL_BASED_OUTPATIENT_CLINIC_OR_DEPARTMENT_OTHER): Payer: Self-pay

## 2017-10-05 ENCOUNTER — Other Ambulatory Visit: Payer: Self-pay

## 2017-10-05 ENCOUNTER — Emergency Department (HOSPITAL_BASED_OUTPATIENT_CLINIC_OR_DEPARTMENT_OTHER)
Admission: EM | Admit: 2017-10-05 | Discharge: 2017-10-05 | Disposition: A | Discharge status: Home / Self Care | Payer: Self-pay | Attending: Emergency Medicine | Admitting: Emergency Medicine

## 2017-10-05 ENCOUNTER — Encounter (HOSPITAL_BASED_OUTPATIENT_CLINIC_OR_DEPARTMENT_OTHER): Payer: Self-pay

## 2017-10-05 DIAGNOSIS — M25561 Pain in right knee: Secondary | ICD-10-CM | POA: Insufficient documentation

## 2017-10-05 HISTORY — DX: Unspecified osteoarthritis, unspecified site: M19.90

## 2017-10-05 MED ORDER — ACETAMINOPHEN 500 MG PO TABS: 500.0000 mg | ORAL_TABLET | Freq: Four times a day (QID) | ORAL | 0 refills | Status: DC | PRN

## 2017-10-05 MED ORDER — MELOXICAM 15 MG PO TABS: 15.0000 mg | ORAL_TABLET | Freq: Every day | ORAL | 0 refills | Status: DC

## 2017-10-05 NOTE — Discharge Instructions (Addendum)
Please read and follow all provided instructions.  Your diagnoses today include: Knee Effusion   Tests performed today include: X-rays of the affected joint. This did show some degenerative/arthritic like changes.  Vital signs. See below for your results today.   Be sure to read and understand instructions below prior to leaving the hospital. If your symptoms persist without any improvement in 1 week it is recommended that you follow up with the orthopedics listed above. Use your pain medication as prescribed.  Knee Effusion  The medical term for having fluid in your knee is effusion. This means something is wrong inside the knee. Some of the causes of fluid in the knee may be torn cartilage, a torn ligament, or bleeding into the joint from an injury. Small tears may heal on their own with conservative treatment. Conservative means rest, limited weight bearing activity and muscle strengthening exercises. Your recovery may take up to 6 weeks. Larger tears may require surgery.   TREATMENT  Rest, ice, compression and elevation (RICE therapy) are the basic modes of treatment.   Rest is needed to allow your body to heal. Routine activities can be resumed when comfortable (as described above).  Ice: Ice or gel packs can be used to reduce both pain and swelling. Ice is the most helpful within the first 24 to 48 hours after an injury or flareup from overusing a muscle or joint.  Ice is effective, has very few side effects, and is safe for most people to use. Place a dry or damp towel between the ice and skin. A damp towel will cool the skin more quickly, so you may need to shorten the time that the ice is used. For a more rapid response, add gentle compression to the ice. Ice for no more than 10 to 20 minutes at a time. The bonier the area you are icing, the less time it will take to get the benefits of ice. Check your skin after 5 minutes to make sure there are no signs of a poor response to cold or skin  damage. Rest 20 minutes or more in between uses. Once your skin is numb, you can end your treatment. You can test numbness by very lightly touching your skin. The touch should be so light that you do not see the skin dimple from the pressure of your fingertip. When using ice, most people will feel these normal sensations in this order: cold, burning, aching, and numbness. Do not use ice on someone who cannot communicate their responses to pain, such as small children or people with dementia.  If you expose your skin to cold temperatures for too long or without the proper protection, you can damage your skin or nerves. Watch for signs of skin damage due to cold.  Compression: this helps keep swelling down. It also gives support and helps with discomfort. If any lasting bandage has been applied, it should be removed and reapplied every 3-4 hours. It should not be applied tightly, but firmly enough to keep swelling down. Watch fingers or toes for swelling, discoloration, coldness, numbness or excessive pain. If any of these problems occur, removed the bandage and reapply loosely. Contact your caregiver if these problems continue. If you were given an knee imobilizer you may take it off at night and to take a shower or bath. Wiggle your toes in the splint several times per day if you are able.  Elevation helps reduce swelling and decrease your pain. With extremities such as the  arms, hands, legs and feet, the injured area should be placed near or above the level of the heart if possible (place pillows underneath you leg/foot while you sleep to achieve this). If your caregiver recommends crutches, use them as instructed (for up to) 1 week.  Do not drive a vehicle on pain medication. ACTIVITY:            - Weight bearing as tolerated            - Exercises should be limited to pain free range of motion  Knee Immobilization:: This is used to support and protect an injured or painful knee. Knee immobilizers keep  your knee from being used while it is healing.  Use powder to control irritation from sweat and friction.  Adjust the immobilizer to be firm but not tight. Signs of an immobilizer that is too tight include:   Swelling.   Numbness.   Color change in your foot or ankle.   Increased pain.  While resting, raise your leg above the level of your heart. This reduces throbbing and helps healing. Prop it up with pillows.  Remove the immobilizer to bathe and sleep. Wear it other times until you see your doctor again.               SEEK MEDICAL CARE IF:  You have an increase in bruising, swelling, or pain.  Your toes feel cold.  Pain relief is not achieved with medications.  EMERGENCY:: Your toes are numb or blue or you have severe pain.  You notice redness, swelling, warmth or increasing pain in your knee.  An unexplained oral temperature above 102 F (38.9 C) develops.  HOW TO MAKE AN ICE PACK  To make an ice pack, do one of the following:  Place crushed ice or a bag of frozen vegetables in a sealable plastic bag. Squeeze out the excess air. Place this bag inside another plastic bag. Slide the bag into a pillowcase or place a damp towel between your skin and the bag.  Mix 3 parts water with 1 part rubbing alcohol. Freeze the mixture in a sealable plastic bag. When you remove the mixture from the freezer, it will be slushy. Squeeze out the excess air. Place this bag inside another plastic bag. Slide the bag into a pillowcase or place a damp towel between your skin.  Additional Information:  Your vital signs today were: BP (!) 142/94 (BP Location: Left Arm)    Pulse 77    Temp 98.4 F (36.9 C) (Oral)    Resp 18    Ht 5\' 4"  (1.626 m)    Wt 98.4 kg    LMP 12/16/2013    SpO2 100%    BMI 37.25 kg/m  If your blood pressure (BP) was elevated above 135/85 this visit, please have this repeated by your doctor within one month. ---------------

## 2017-10-05 NOTE — ED Triage Notes (Signed)
Pt states she has chronic pain to R knee that is normally relieved by cream. Pt states she is unable to get relief today.

## 2017-10-05 NOTE — ED Provider Notes (Signed)
[facility] EMERGENCY DEPARTMENT Provider Note   CSN: [id number] Arrival date & time: 10/05/17  1356     History   Chief Complaint Chief Complaint  Patient presents with  . Knee Pain    HPI Lacey Green is a 57 y.o. female with a history of arthritis who presents emergency department today for right knee pain that began last night.  Patient reports that she has dealt with knee pain in this area for the last several years.  She reports she typically uses a TENS therapy or topical BenGay for symptoms with relief.  She [place] when she started developing pain in her right knee on the medial aspect that was worsened with each step.  She notes that the knee has swollen slightly since that time.  She has tried 1 dose of naproxen for symptoms with mild relief.  She states she normally does not need to take oral medication for pain control.  She denies any fever, injury, numbness/tingling/weakness.  No pain proximal or distal to the knee.  HPI  Past Medical History:  Diagnosis Date  . Arthritis   . RFFMBWGY(659.9)     Patient Active Problem List   Diagnosis Date Noted  . NECK PAIN, CHRONIC 07/19/2008  . OTHER CONSTIPATION 12/30/2007  . HEMORRHOIDS 08/27/2007  . GERD 08/27/2007  . PEPTIC STRICTURE 08/27/2007  . HIATAL HERNIA 08/27/2007  . 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 HEADACHE 03/05/2007  . CHEST PAIN 03/05/2007  . COLPOSCOPY, HX OF 03/05/2007    Past Surgical History:  Procedure Laterality Date  . BUNIONECTOMY    . WISDOM TOOTH EXTRACTION       OB History    Gravida  7   Para  3   Term      Preterm      AB  4   Living        SAB      TAB      Ectopic      Multiple      Live Births               Home Medications    Prior to Admission medications   Medication Sig Start Date End Date Taking? Authorizing Provider  HYDROcodone-acetaminophen (NORCO/VICODIN) 5-325 MG tablet  Take 1-2 tablets by mouth every 6 (six) hours as needed. 11/23/14   Blanchie Dessert, MD  ipratropium (ATROVENT) 0.03 % nasal spray Place 2 sprays into both nostrils 2 (two) times daily. 03/20/14   Ezekiel Slocumb, PA-C    Family History Family History  Problem Relation Age of Onset  . Hypertension Mother   . Colon polyps Mother   . Diabetes Father   . Cancer Father        prostate  . Cancer Maternal Aunt        ovarian  . Cancer Paternal Uncle        stomach  . Hypertension Maternal Grandmother   . Arthritis Maternal Grandmother   . Diabetes Maternal Grandfather   . Heart disease Paternal Grandfather        MI  . Cancer Paternal Grandmother        breast; left mastectomy    Social History Social History   Tobacco Use  . Smoking status: Never Smoker  . Smokeless tobacco: Never Used  Substance Use Topics  . Alcohol use: Yes    Comment: occasional  . Drug use: No  Allergies   Penicillins   Review of Systems Review of Systems  Constitutional: Negative for fever.  Musculoskeletal: Positive for arthralgias.  Skin: Negative for color change and wound.  Neurological: Negative for weakness and numbness.  All other systems reviewed and are negative.    Physical Exam Updated Vital Signs BP (!) 142/94 (BP Location: Left Arm)   Pulse 77   Temp 98.4 F (36.9 C) (Oral)   Resp 18   Ht 5\' 4"  (1.626 m)   Wt 98.4 kg   LMP 12/16/2013   SpO2 100%   BMI 37.25 kg/m   Physical Exam  Constitutional: She appears well-developed and well-nourished.  HENT:  Head: Normocephalic and atraumatic.  Right Ear: External ear normal.  Left Ear: External ear normal.  Eyes: Conjunctivae are normal. Right eye exhibits no discharge. Left eye exhibits no discharge. No scleral icterus.  Cardiovascular:  Pulses:      Dorsalis pedis pulses are 2+ on the right side.       Posterior tibial pulses are 2+ on the right side.  Pulmonary/Chest: Effort normal. No respiratory distress.    Musculoskeletal:       Right hip: Normal.       Right knee: She exhibits decreased range of motion (intact extension. Limit ROM of flexion to 90 2/2 swelling and pain. ) and effusion. She exhibits no erythema, normal alignment, no LCL laxity, no bony tenderness and no MCL laxity. Tenderness found. Medial joint line tenderness noted.       Right ankle: Normal.  Negative Lachman's test.  Mild effusion.  No overlying erythema, heat.  No breaks of the skin.  No tenderness palpation of the quadriceps or calf.  No lower extremity edema. Neurovascularly intact distally. Compartments soft above and below affected joint.   Neurological: She is alert. She has normal strength. No sensory deficit.  Skin: Skin is warm, dry and intact. Capillary refill takes less than 2 seconds. No erythema. No pallor.  Psychiatric: She has a normal mood and affect.  Nursing note and vitals reviewed.    ED Treatments / Results  Labs (all labs ordered are listed, but only abnormal results are displayed) Labs Reviewed - No data to display  EKG None  Radiology Dg Knee Complete 4 Views Right  Result Date: 10/05/2017 CLINICAL DATA:  Medial knee pain for 2 days, no known injury, initial encounter EXAM: RIGHT KNEE - COMPLETE 4+ VIEW COMPARISON:  None. FINDINGS: Mild degenerative changes are noted in all 3 joint compartments. No acute fracture or dislocation is noted. No joint effusion is seen. IMPRESSION: Degenerative change without acute abnormality. Electronically Signed   By: Inez Catalina M.D.   On: 10/05/2017 15:07    Procedures Procedures (including critical care time)  Medications Ordered in ED Medications - No data to display   Initial Impression / Assessment and Plan / ED Course  I have reviewed the triage vital signs and the nursing notes.  Pertinent labs & imaging results that were available during my care of the patient were reviewed by me and considered in my medical decision making (see chart for  details).     57 y.o. female with mild swelling to the joint spaces, knee swelling, tightness in the knee, and mildly restricted range of motion. Pt unable to perform full flexion of the knee.  Pt is without systemic symptoms, erythema or redness of the joint consistent with gout or septic joint.  She is neurovascular intact.  Compartments are soft.  Patient X-Ray  negative for obvious fracture or dislocation. Reviwed by myself. Patient did not wish for pain control in the department. Pt advised to follow up with orthopedics if symptoms persist for further evaluation and treatment.  Patient is followed by [facility]oped[facility].  Patient given brace while in ED, conservative therapy recommended and discussed. Patient will be dc home & is agreeable with above plan.  Return precautions discussed.  Final Clinical Impressions(s) / ED Diagnoses   Final diagnoses:  Acute pain of right knee    ED Discharge Orders         Ordered    meloxicam (MOBIC) 15 MG tablet  Daily     10/05/17 1558    acetaminophen (TYLENOL) 500 MG tablet  Every 6 hours PRN     10/05/17 1558           Lorelle Gibbs 10/05/17 1731    Fredia Sorrow, MD 10/08/17 [phone]

## 2018-02-26 ENCOUNTER — Encounter: Payer: Self-pay | Admitting: Gastroenterology

## 2018-05-28 ENCOUNTER — Encounter: Payer: Self-pay | Admitting: Gastroenterology

## 2019-02-16 ENCOUNTER — Other Ambulatory Visit: Payer: Self-pay

## 2019-02-16 ENCOUNTER — Ambulatory Visit: Payer: Self-pay | Admitting: Medical

## 2019-02-23 ENCOUNTER — Ambulatory Visit: Payer: 59 | Admitting: Medical

## 2019-02-23 ENCOUNTER — Other Ambulatory Visit: Payer: Self-pay

## 2019-02-23 ENCOUNTER — Encounter: Payer: Self-pay | Admitting: Medical

## 2019-02-23 VITALS — BP 127/73 | HR 92 | Temp 96.5°F | Resp 12 | Ht 64.5 in | Wt 221.8 lb

## 2019-02-23 DIAGNOSIS — R030 Elevated blood-pressure reading, without diagnosis of hypertension: Secondary | ICD-10-CM | POA: Diagnosis not present

## 2019-02-23 DIAGNOSIS — R0789 Other chest pain: Secondary | ICD-10-CM

## 2019-02-23 DIAGNOSIS — M940 Chondrocostal junction syndrome [Tietze]: Secondary | ICD-10-CM | POA: Diagnosis not present

## 2019-02-23 NOTE — Patient Instructions (Addendum)
You blood pressure level is very well controlled presently. 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 Your at home readings match readings you got here.   For next 2-3 days want you to use low dose ibuprofen 200- 400 mg every 8 hours. This can help with cartlidge pain in chest wall. (if you see a bp rise above 140/90 then stop ibuprofen)  You are new to our office and we did ekg. Your ekg showed normal sinus rhythm. You full risk factor profile not known. I want you to get scheduled for lab tomorrow to get cmp and lipid panel. Our lab is closed presently. If your  Costochondritis type pain worsen or other cardiac type symptoms then ED evaluation. I can't get any cardiac type studies presently since lab closed.  If your pain in lower sternum might consider referral to cardiologist.  Keep checking blood pressure daily.   Follow up in one week or as needed

## 2019-02-23 NOTE — Progress Notes (Signed)
Subjective:    Patient ID: Lacey Green, female    DOB: 09-24-1960, 59 y.o.   MRN: [id number]  HPI  Pt in for first time. Pt states her pcp changes office. She employment location changes to [place] just recently.  Pt states last week when she had dot exam her was high and it was high. She states her bp at dot 177/103, 169/105, 171/99. At mom next day 211/123. Then on 30 th 183/112. Then yesterday was 118/84 and 118/98.   Pt never had to take medication in past. States with diet and exercise she controlled her bp in past. Many persons in her family have high blood pressure   Pt states 1-2  week of lower chest pain when she was driving. Pain on palpation and deep inspiration. Pt does not smoke. No hx of high cholesterol. No jaw pain, no shoulder pain. And no arm pain.   Past Medical History:  Diagnosis Date  . Arthritis   . Headache(784.0)      Social History   Socioeconomic History  . Marital status: Divorced    Spouse name: Not on file  . Number of children: 3  . Years of education: Not on file  . Highest education level: Not on file  Occupational History    Employer: [employer]  Tobacco Use  . Smoking status: Never Smoker  . Smokeless tobacco: Never Used  Substance and Sexual Activity  . Alcohol use: Yes    Comment: occasional  . Drug use: No  . Sexual activity: Not on file  Other Topics Concern  . Not on file  Social History Narrative   Exercise: yes; except for the last 2 months.   Caffeine: 2 cups coffee daily.         Social Determinants of Health   Financial Resource Strain:   . Difficulty of Paying Living Expenses: Not on file  Food Insecurity:   . Worried About Charity fundraiser in the Last Year: Not on file  . Ran Out of Food in the Last Year: Not on file  Transportation Needs:   . Lack of Transportation (Medical): Not on file  . Lack of Transportation (Non-Medical): Not on file  Physical Activity:   . Days of Exercise per Week: Not on file  .  Minutes of Exercise per Session: Not on file  Stress:   . Feeling of Stress : Not on file  Social Connections:   . Frequency of Communication with Friends and Family: Not on file  . Frequency of Social Gatherings with Friends and Family: Not on file  . Attends Religious Services: Not on file  . Active Member of Clubs or Organizations: Not on file  . Attends Archivist Meetings: Not on file  . Marital Status: Not on file  Intimate Partner Violence:   . Fear of Current or Ex-Partner: Not on file  . Emotionally Abused: Not on file  . Physically Abused: Not on file  . Sexually Abused: Not on file    Past Surgical History:  Procedure Laterality Date  . BUNIONECTOMY    . WISDOM TOOTH EXTRACTION      Family History  Problem Relation Age of Onset  . Hypertension Mother   . Colon polyps Mother   . Diabetes Father   . Cancer Father        prostate  . Cancer Maternal Aunt        ovarian  . Cancer Paternal Uncle  stomach  . Hypertension Maternal Grandmother   . Arthritis Maternal Grandmother   . Diabetes Maternal Grandfather   . Heart disease Paternal Grandfather        MI  . Cancer Paternal Grandmother        breast; left mastectomy    Allergies  Allergen Reactions  . Penicillins     Current Outpatient Medications on File Prior to Visit  Medication Sig Dispense Refill  . ipratropium (ATROVENT) 0.03 % nasal spray Place 2 sprays into both nostrils 2 (two) times daily. (Patient not taking: Reported on 02/23/2019) 30 mL 0   No current facility-administered medications on file prior to visit.    BP 127/73 (BP Location: Right Arm, Cuff Size: Large)   Pulse 92   Temp (!) 96.5 F (35.8 C) (Temporal)   Resp 12   Ht 5' 4.5" (1.638 m)   Wt 221 lb 12.8 oz (100.6 kg)   LMP 12/16/2013   SpO2 100%   BMI 37.48 kg/m     Review of Systems  Constitutional: Negative for chills, fatigue and fever.  Respiratory: Negative for cough, chest tightness, shortness of  breath and wheezing.   Cardiovascular: Negative for chest pain and palpitations.       Msk type chest pain. Pt seen at 459. Labs close at 430. Pt had chest pain for weeks. Pain on palpation and deep inspiration.   Gastrointestinal: Positive for abdominal pain. Negative for constipation and nausea.       Some abdomen pain last week. Maybe mild reflux symptoms.  Genitourinary: Negative for dysuria.  Musculoskeletal: Negative for back pain, joint swelling and neck pain.  Skin: Negative for rash.  Neurological: Negative for dizziness, speech difficulty, weakness and headaches.  Hematological: Negative for adenopathy. Does not bruise/bleed easily.  Psychiatric/Behavioral: Negative for behavioral problems and confusion. The patient is not nervous/anxious.        Objective:   Physical Exam   General Mental Status- Alert. General Appearance- Not in acute distress.   Skin General: Color- Normal Color. Moisture- Normal Moisture.  Neck Carotid Arteries- Normal color. Moisture- Normal Moisture. No carotid bruits. No JVD.  Chest and Lung Exam Auscultation: Breath Sounds:-Normal.  Cardiovascular Auscultation:Rythm- Regular. Murmurs & Other Heart Sounds:Auscultation of the heart reveals- No Murmurs.  Abdomen Inspection:-Inspeection Normal. Palpation/Percussion:Note:No mass. Palpation and Percussion of the abdomen reveal- Non Tender, Non Distended + BS, no rebound or guarding.    Neurologic Cranial Nerve exam:- CN III-XII intact(No nystagmus), symmetric smile. Strength:- 5/5 equal and symmetric strength both upper and lower extremities.  Anterior chest- lower 1/3 chest tender to palpation. Pain on deep inspiration.     Assessment & Plan:  You blood pressure level is very well controlled presently. 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 Your at home readings match readings you got here.   For next 2-3 days want you to use low dose ibuprofen 200- 400 mg every 8  hours. This can help with cartlidge pain in chest wall.  You are new to our office and we did ekg. Your ekg showed normal sinus rhythm. You full risk factor profile not known. I want you to get scheduled for lab tomorrow to get cmp and lipid panel. Our lab is closed presently. If your  Costochondritis type  pain worsen or other cardiac type symptoms then ED evaluation. I can't get any cardiac type studies presently since lab closed.  If your pain in lower sternum might consider referral to cardiologist.  Keep  checking blood pressure daily.   Follow up in one week or as needed  30 + minutes spent with pt. 50% of time spent counseling pt on plan going forward.  Mackie Pai, PA-C

## 2019-02-24 ENCOUNTER — Other Ambulatory Visit (INDEPENDENT_AMBULATORY_CARE_PROVIDER_SITE_OTHER): Payer: 59

## 2019-02-24 ENCOUNTER — Other Ambulatory Visit: Payer: Self-pay

## 2019-02-24 DIAGNOSIS — R030 Elevated blood-pressure reading, without diagnosis of hypertension: Secondary | ICD-10-CM

## 2019-02-24 DIAGNOSIS — R0789 Other chest pain: Secondary | ICD-10-CM

## 2019-02-25 LAB — COMPREHENSIVE METABOLIC PANEL
ALT: 19 U/L (ref 0–35)
AST: 17 U/L (ref 0–37)
Albumin: 4.2 g/dL (ref 3.5–5.2)
Alkaline Phosphatase: 112 U/L (ref 39–117)
BUN: 10 mg/dL (ref 6–23)
CO2: 27 mEq/L (ref 19–32)
Calcium: 9.9 mg/dL (ref 8.4–10.5)
Chloride: 107 mEq/L (ref 96–112)
Creatinine, Ser: 0.82 mg/dL (ref 0.40–1.20)
GFR: 86.88 mL/min (ref >60.00)
Glucose, Bld: 83 mg/dL (ref 70–99)
Potassium: 3.9 mEq/L (ref 3.5–5.1)
Sodium: 141 mEq/L (ref 135–145)
Total Bilirubin: 0.6 mg/dL (ref 0.2–1.2)
Total Protein: 6.6 g/dL (ref 6.0–8.3)

## 2019-02-25 LAB — LIPID PANEL
Cholesterol: 197 mg/dL (ref 0–200)
HDL: 81.8 mg/dL (ref >39.00)
LDL Cholesterol: 102 mg/dL — ABNORMAL HIGH (ref 0–99)
NonHDL: 114.86
Total CHOL/HDL Ratio: 2
Triglycerides: 62 mg/dL (ref 0.0–149.0)
VLDL: 12.4 mg/dL (ref 0.0–40.0)

## 2019-02-26 ENCOUNTER — Telehealth: Payer: Self-pay | Admitting: Medical

## 2019-02-26 NOTE — Telephone Encounter (Signed)
Pt called back for lab results. Best # [phone].

## 2019-03-02 ENCOUNTER — Other Ambulatory Visit: Payer: Self-pay

## 2019-03-03 ENCOUNTER — Ambulatory Visit: Payer: 59 | Admitting: Medical

## 2019-03-03 ENCOUNTER — Encounter: Payer: Self-pay | Admitting: Medical

## 2019-03-03 ENCOUNTER — Ambulatory Visit (HOSPITAL_BASED_OUTPATIENT_CLINIC_OR_DEPARTMENT_OTHER)
Admission: RE | Admit: 2019-03-03 | Discharge: 2019-03-03 | Disposition: A | Discharge status: Home / Self Care | Payer: 59 | Source: Ambulatory Visit | Attending: Medical | Admitting: Medical

## 2019-03-03 ENCOUNTER — Other Ambulatory Visit: Payer: Self-pay

## 2019-03-03 VITALS — BP 138/73 | HR 81 | Temp 96.1°F | Resp 12 | Ht 64.5 in | Wt 219.4 lb

## 2019-03-03 DIAGNOSIS — M940 Chondrocostal junction syndrome [Tietze]: Secondary | ICD-10-CM | POA: Diagnosis not present

## 2019-03-03 DIAGNOSIS — R030 Elevated blood-pressure reading, without diagnosis of hypertension: Secondary | ICD-10-CM

## 2019-03-03 DIAGNOSIS — R0789 Other chest pain: Secondary | ICD-10-CM | POA: Insufficient documentation

## 2019-03-03 DIAGNOSIS — R5383 Other fatigue: Secondary | ICD-10-CM

## 2019-03-03 LAB — CBC
HCT: 42.2 % (ref 36.0–46.0)
Hemoglobin: 14.2 g/dL (ref 12.0–15.0)
MCHC: 33.6 g/dL (ref 30.0–36.0)
MCV: 92.1 fl (ref 78.0–100.0)
Platelets: 201 10*3/uL (ref 150.0–400.0)
RBC: 4.58 Mil/uL (ref 3.87–5.11)
RDW: 12.8 % (ref 11.5–15.5)
WBC: 4.9 10*3/uL (ref 4.0–10.5)

## 2019-03-03 LAB — VITAMIN B12: Vitamin B-12: 553 pg/mL (ref 211–911)

## 2019-03-03 MED ORDER — KETOROLAC TROMETHAMINE 30 MG/ML IJ SOLN
30.0000 mg | Freq: Once | INTRAMUSCULAR | Status: AC
  Administered 2019-03-03: 30 mg via INTRAMUSCULAR

## 2019-03-03 NOTE — Patient Instructions (Signed)
You do have history of elevated blood pressures in the past but most recently the majority of your blood pressures are in good control. I did question accuracy of the reading since you have been seen is about 59 years old and occasionally get random high level spikes. Do recommend she get the blood pressure cuff and check your blood pressures daily. I want to see that your blood pressure is less than 140/90. It would be better if your blood pressure was closer to 130/80. If mild high blood pressure readings would likely give low dose ACE inhibitor or low-dose ARB.  You do have some atypical chest pain history in the past. Your EKG was negative last time. Your features on exam seem to be more consistent with costochondritis or muscular pain. Your direct pain on palpation over the xiphoid process. Will give Toradol 30 mg IM injection. We will see if your pain is resolved. If not then would refer you to cardiologist for safety sake sometime within a week or so. If any severe or worsening changing signs/symptoms and recommend ED evaluation.  He has a history of mild sugar elevation in the past. We will get A1c, CBC, B12, B1 and vitamin D level.  Follow-up in 2weeks. I want you to have your blood pressure machine available and will go over readings. It is okay for your visit to be virtual phone or video visit.

## 2019-03-03 NOTE — Progress Notes (Signed)
Subjective:    Patient ID: Lacey Green, female    DOB: 1960-11-11, 59 y.o.   MRN: [id number]  HPI  Pt in for htn follow up. Pt machine is old/2010 or 2011 model. Pt bp readings show 141/93 abd 154/103.  Today when medical assistant checked bp was 138/73.  Pt did check her bp at home with old machine. 110/79, 115/76, 133/92, 144/104, 127/85 and 117/83, 102/90 and 149/101. This morning bp was 128/83 with her machine. Then in our office with her machine bp 141/93 and 154/103.   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 See last note.   Pt states some lower chest and xyphoid area pain on 02/27/2019. Pain was brief. She put vics rub on area and went to bed. No associated cardiac type signs or sypmptoms. No pain for 4 days. Last ekg was normal. Last lipid panel showed minimal ldl elevation. Pt did take ibuprofen and she thinks that did help her lower chest wall area pain. But she notes on and off pain lower chest that occurs since January with occasional nausea.  Hx of elevated sugar in the past.   Review of Systems  Constitutional: Positive for fatigue. Negative for chills and fever.  HENT: Negative for dental problem.   Respiratory: Negative for chest tightness, shortness of breath and wheezing.   Cardiovascular: Negative for chest pain and palpitations.       See HPI.  Gastrointestinal: Negative for abdominal pain, blood in stool, diarrhea, nausea and vomiting.  Genitourinary: Negative for dyspareunia and dysuria.  Musculoskeletal: Negative for back pain.       See HPI.  Skin: Negative for rash.  Neurological: Negative for dizziness, speech difficulty, weakness and light-headedness.  Psychiatric/Behavioral: Negative for behavioral problems and confusion.    Past Medical History:  Diagnosis Date  . Arthritis   . Headache(784.0)   . Hypertension      Social History   Socioeconomic History  . Marital status: Divorced    Spouse name: Not on file  . Number of children: 3  . Years  of education: Not on file  . Highest education level: Not on file  Occupational History    Employer: [employer]  Tobacco Use  . Smoking status: Never Smoker  . Smokeless tobacco: Never Used  Substance and Sexual Activity  . Alcohol use: Yes    Comment: occasional  . Drug use: No  . Sexual activity: Not on file  Other Topics Concern  . Not on file  Social History Narrative   Exercise: yes; except for the last 2 months.   Caffeine: 2 cups coffee daily.         Social Determinants of Health   Financial Resource Strain:   . Difficulty of Paying Living Expenses: Not on file  Food Insecurity:   . Worried About Charity fundraiser in the Last Year: Not on file  . Ran Out of Food in the Last Year: Not on file  Transportation Needs:   . Lack of Transportation (Medical): Not on file  . Lack of Transportation (Non-Medical): Not on file  Physical Activity:   . Days of Exercise per Week: Not on file  . Minutes of Exercise per Session: Not on file  Stress:   . Feeling of Stress : Not on file  Social Connections:   . Frequency of Communication with Friends and Family: Not on file  . Frequency of Social Gatherings with Friends and Family: Not on file  . Attends Religious  Services: Not on file  . Active Member of Clubs or Organizations: Not on file  . Attends Archivist Meetings: Not on file  . Marital Status: Not on file  Intimate Partner Violence:   . Fear of Current or Ex-Partner: Not on file  . Emotionally Abused: Not on file  . Physically Abused: Not on file  . Sexually Abused: Not on file    Past Surgical History:  Procedure Laterality Date  . BUNIONECTOMY    . WISDOM TOOTH EXTRACTION      Family History  Problem Relation Age of Onset  . Hypertension Mother   . Colon polyps Mother   . Diabetes Father   . Cancer Father        prostate  . Cancer Maternal Aunt        ovarian  . Cancer Paternal Uncle        stomach  . Hypertension Maternal Grandmother   .  Arthritis Maternal Grandmother   . Diabetes Maternal Grandfather   . Heart disease Paternal Grandfather        MI  . Cancer Paternal Grandmother        breast; left mastectomy    Allergies  Allergen Reactions  . Penicillins     Current Outpatient Medications on File Prior to Visit  Medication Sig Dispense Refill  . ipratropium (ATROVENT) 0.03 % nasal spray Place 2 sprays into both nostrils 2 (two) times daily. (Patient not taking: Reported on 03/03/2019) 30 mL 0   No current facility-administered medications on file prior to visit.    BP 138/73 (BP Location: Left Arm, Cuff Size: Large)   Pulse 81   Temp (!) 96.1 F (35.6 C) (Temporal)   Resp 12   Ht 5' 4.5" (1.638 m)   Wt 219 lb 6.4 oz (99.5 kg)   LMP 12/16/2013   SpO2 100%   BMI 37.08 kg/m       Objective:   Physical Exam  General Mental Status- Alert. General Appearance- Not in acute distress.   Skin General: Color- Normal Color. Moisture- Normal Moisture.  Neck Carotid Arteries- Normal color. Moisture- Normal Moisture. No carotid bruits. No JVD.  Chest and Lung Exam Auscultation: Breath Sounds:-Normal.  Cardiovascular Auscultation:Rythm- Regular. Murmurs & Other Heart Sounds:Auscultation of the heart reveals- No Murmurs.  Abdomen Inspection:-Inspeection Normal. Palpation/Percussion:Note:No mass. Palpation and Percussion of the abdomen reveal- Non Tender, Non Distended + BS, no rebound or guarding.    Neurologic Cranial Nerve exam:- CN III-XII intact(No nystagmus), symmetric smile. Strength:- 5/5 equal and symmetric strength both upper and lower extremities.  Anterior thorax- lower anterior chest/xyphoid area pain on direct palpation.     Assessment & Plan:  You do have history of elevated blood pressures in the past but most recently the majority of your blood pressures are in good control. I did question accuracy of the reading since you have been seen is about 59 years old and occasionally get  random high level spikes. Do recommend she get the blood pressure cuff and check your blood pressures daily. I want to see that your blood pressure is less than 140/90. It would be better if your blood pressure was closer to 130/80. If mild high blood pressure readings would likely give low dose ACE inhibitor or low-dose ARB.  You do have some atypical chest pain history in the past. Your EKG was negative last time. Your features on exam seem to be more consistent with costochondritis or muscular pain. Your direct pain on  palpation over the xiphoid process. Will give Toradol 30 mg IM injection. We will see if your pain is resolved. If not then would refer you to cardiologist for safety sake sometime within a week or so. If any severe or worsening changing signs/symptoms and recommend ED evaluation.  He has a history of mild sugar elevation in the past. We will get A1c, CBC, B12, B1 and vitamin D level.  Follow-up in 2weeks. I want you to have your blood pressure machine available and will go over readings. It is okay for your visit to be virtual phone or video visit.  Total 40 minutes spent with patient today. 50% time spent counseling patient on plan going forward.  Mackie Pai, PA-C

## 2019-03-03 NOTE — Addendum Note (Signed)
Addended by: Kem Boroughs D on: 03/03/2019 09:36 AM   Modules accepted: Orders

## 2019-03-03 NOTE — Telephone Encounter (Signed)
Patient was in office today and labs was reviewed with patient.

## 2019-03-06 LAB — VITAMIN D 1,25 DIHYDROXY
Vitamin D 1, 25 (OH)2 Total: 76 pg/mL — ABNORMAL HIGH (ref 18–72)
Vitamin D2 1, 25 (OH)2: <8 pg/mL
Vitamin D3 1, 25 (OH)2: 76 pg/mL

## 2019-03-06 LAB — VITAMIN B1: Vitamin B1 (Thiamine): 8 nmol/L (ref 8–30)

## 2019-03-07 ENCOUNTER — Telehealth: Payer: Self-pay | Admitting: Medical

## 2019-03-07 NOTE — Telephone Encounter (Signed)
Opened to review 

## 2019-03-17 ENCOUNTER — Other Ambulatory Visit: Payer: Self-pay

## 2019-03-17 ENCOUNTER — Ambulatory Visit (INDEPENDENT_AMBULATORY_CARE_PROVIDER_SITE_OTHER): Payer: 59 | Admitting: Medical

## 2019-03-17 VITALS — BP 150/87 | HR 80

## 2019-03-17 DIAGNOSIS — M940 Chondrocostal junction syndrome [Tietze]: Secondary | ICD-10-CM | POA: Diagnosis not present

## 2019-03-17 DIAGNOSIS — I10 Essential (primary) hypertension: Secondary | ICD-10-CM | POA: Diagnosis not present

## 2019-03-17 MED ORDER — LOSARTAN POTASSIUM 25 MG PO TABS: 25.0000 mg | ORAL_TABLET | Freq: Every day | ORAL | 0 refills | Status: DC

## 2019-03-17 NOTE — Progress Notes (Signed)
Subjective:    Patient ID: Lacey Green, female    DOB: [birth year], 59 y.o.   MRN: [id number]  HPI  Virtual Visit via Video Note  I connected with [name] on 03/17/19 at  8:00 AM EST by a video enabled telemedicine application and verified that I am speaking with the correct person using two identifiers.  Location: Patient: home Provider: office   I discussed the limitations of evaluation and management by telemedicine and the availability of in person appointments. The patient expressed understanding and agreed to proceed.  History of Present Illness:  Pt bp is still high. Her level have been 138/79, 110/93,126/91, 141/89, 131/93, 126/91 and 150/87. 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 Pt needs to be less than 140/90.  Pt chest wall pain did decrease greatly with toradol injection. About 90% better. Slight sore with movement. Pt declines cardilogist referral.    Observations/Objective: General-no acute distress, pleasant, oriented. Lungs- on inspection lungs appear unlabored. Neck- no tracheal deviation or jvd on inspection. Neuro- gross motor function appears intact.  Assessment and Plan: For high blood will rx losartan. Check bp daily. Want your bp to be better controlled in light of upcoming dot physical. My chart me in a week with bp readings. Might need to increase dose after review.  You had quick response to toradol indicating probable costochondritis. Minimal faint discomfort came back with movement. Recommended cardiologist referral but you declined. If pain return as before let us know . If severe or cardiac like associated signs/symptoms then ED evaluation.  Follow up date to be determined after review of my chart bp reading update.  Tannah Dreyfuss, PA-C   30 minutes spent with pt.  Follow Up Instructions:    I discussed the assessment and treatment plan with the patient. The patient was provided an opportunity to ask questions and all were  answered. The patient agreed with the plan and demonstrated an understanding of the instructions.   The patient was advised to call back or seek an in-person evaluation if the symptoms worsen or if the condition fails to improve as anticipated.  I provided 20+ minutes of non-face-to-face time during this encounter.   Mackie Pai, PA-C   Review of Systems  Constitutional: Negative for chills, fatigue and fever.  Respiratory: Negative for choking, chest tightness and wheezing.   Cardiovascular: Negative for chest pain and palpitations.  Gastrointestinal: Negative for abdominal distention and anal bleeding.  Musculoskeletal: Negative for back pain.  Skin: Negative for rash.  Neurological: Negative for dizziness and headaches.  Hematological: Negative for adenopathy. Does not bruise/bleed easily.  Psychiatric/Behavioral: Negative for behavioral problems and decreased concentration. The patient is not nervous/anxious.     Past Medical History:  Diagnosis Date  . Arthritis   . Headache(784.0)   . Hypertension      Social History   Socioeconomic History  . Marital status: Divorced    Spouse name: Not on file  . Number of children: 3  . Years of education: Not on file  . Highest education level: Not on file  Occupational History    Employer: [employer]  Tobacco Use  . Smoking status: Never Smoker  . Smokeless tobacco: Never Used  Substance and Sexual Activity  . Alcohol use: Yes    Comment: occasional  . Drug use: No  . Sexual activity: Not on file  Other Topics Concern  . Not on file  Social History Narrative   Exercise: yes; except for the last  2 months.   Caffeine: 2 cups coffee daily.         Social Determinants of Health   Financial Resource Strain:   . Difficulty of Paying Living Expenses: Not on file  Food Insecurity:   . Worried About Charity fundraiser in the Last Year: Not on file  . Ran Out of Food in the Last Year: Not on file  Transportation Needs:     . Lack of Transportation (Medical): Not on file  . Lack of Transportation (Non-Medical): Not on file  Physical Activity:   . Days of Exercise per Week: Not on file  . Minutes of Exercise per Session: Not on file  Stress:   . Feeling of Stress : Not on file  Social Connections:   . Frequency of Communication with Friends and Family: Not on file  . Frequency of Social Gatherings with Friends and Family: Not on file  . Attends Religious Services: Not on file  . Active Member of Clubs or Organizations: Not on file  . Attends Archivist Meetings: Not on file  . Marital Status: Not on file  Intimate Partner Violence:   . Fear of Current or Ex-Partner: Not on file  . Emotionally Abused: Not on file  . Physically Abused: Not on file  . Sexually Abused: Not on file    Past Surgical History:  Procedure Laterality Date  . BUNIONECTOMY    . WISDOM TOOTH EXTRACTION      Family History  Problem Relation Age of Onset  . Hypertension Mother   . Colon polyps Mother   . Diabetes Father   . Cancer Father        prostate  . Cancer Maternal Aunt        ovarian  . Cancer Paternal Uncle        stomach  . Hypertension Maternal Grandmother   . Arthritis Maternal Grandmother   . Diabetes Maternal Grandfather   . Heart disease Paternal Grandfather        MI  . Cancer Paternal Grandmother        breast; left mastectomy    Allergies  Allergen Reactions  . Penicillins     No current outpatient medications on file prior to visit.   No current facility-administered medications on file prior to visit.    BP (!) 150/87   Pulse 80   LMP 12/16/2013       Objective:   Physical Exam        Assessment & Plan:

## 2019-03-17 NOTE — Patient Instructions (Signed)
For high blood will rx losartan. Check bp daily. Want your bp to be better controlled in light of upcoming dot physical. My chart me in a week with bp readings. Might need to increase dose after review.  You had quick response to toradol indicating probable costochondritis. Minimal faint discomfort came back with movement. Recommended cardiologist referral but you declined. If pain return as before let us know . If severe or cardiac like associated signs/symptoms then ED evaluation.  Follow up date to be determined after review of my chart bp reading update.

## 2019-03-30 ENCOUNTER — Ambulatory Visit: Payer: 59 | Admitting: Family Medicine

## 2019-03-30 ENCOUNTER — Other Ambulatory Visit: Payer: Self-pay

## 2019-03-30 ENCOUNTER — Encounter: Payer: Self-pay | Admitting: Family Medicine

## 2019-03-30 VITALS — BP 120/72 | HR 90 | Temp 96.9°F | Ht 64.0 in | Wt 221.1 lb

## 2019-03-30 DIAGNOSIS — M25531 Pain in right wrist: Secondary | ICD-10-CM

## 2019-03-30 DIAGNOSIS — I10 Essential (primary) hypertension: Secondary | ICD-10-CM

## 2019-03-30 DIAGNOSIS — M25532 Pain in left wrist: Secondary | ICD-10-CM | POA: Diagnosis not present

## 2019-03-30 DIAGNOSIS — N3281 Overactive bladder: Secondary | ICD-10-CM | POA: Diagnosis not present

## 2019-03-30 MED ORDER — OXYBUTYNIN CHLORIDE ER 10 MG PO TB24: 10.0000 mg | ORAL_TABLET | Freq: Every day | ORAL | 2 refills | Status: DC

## 2019-03-30 MED ORDER — LOSARTAN POTASSIUM 25 MG PO TABS: 25.0000 mg | ORAL_TABLET | Freq: Every day | ORAL | 5 refills | Status: DC

## 2019-03-30 NOTE — Patient Instructions (Addendum)
Keep the diet clean and stay active.  Wear the braces at night and when you drive. Also when you are doing things that flare up your symptoms.  Ice/cold pack over area for 10-15 min twice daily.  Heat (pad or rice pillow in microwave) over affected area, 10-15 minutes twice daily.   Wrist and Forearm Exercises Do exercises exactly as told by your health care provider and adjust them as directed. It is normal to feel mild stretching, pulling, tightness, or discomfort as you do these exercises, but you should stop right away if you feel sudden pain or your pain gets worse.   RANGE OF MOTION EXERCISES These exercises warm up your muscles and joints and improve the movement and flexibility of your injured wrist and forearm. These exercises also help to relieve pain, numbness, and tingling. These exercises are done using the muscles in your injured wrist and forearm. Exercise A: Wrist Flexion, Active 1. With your fingers relaxed, bend your wrist forward as far as you can. 2. Hold this position for 30 seconds. Repeat 2 times. Complete this exercise 3 times per week. Exercise B: Wrist Extension, Active 1. With your fingers relaxed, bend your wrist backward as far as you can. 2. Hold this position for 30 seconds. Repeat 2 times. Complete this exercise 3 times per week. Exercise C: Supination, Active  1. Stand or sit with your arms at your sides. 2. Bend your left / right elbow to an "L" shape (90 degrees). 3. Turn your palm upward until you feel a gentle stretch on the inside of your forearm. 4. Hold this position for 30 seconds. 5. Slowly return your palm to the starting position. Repeat 2 times. Complete this exercise 3 times per week. Exercise D: Pronation, Active  1. Stand or sit with your arms at your sides. 2. Bend your left / right elbow to an "L" shape (90 degrees). 3. Turn your palm downward until you feel a gentle stretch on the top of your forearm. 4. Hold this position for 30  seconds. 5. Slowly return your palm to the starting position. Repeat 2 times. Complete this exercise once a day.  STRETCHING EXERCISES These exercises warm up your muscles and joints and improve the movement and flexibility of your injured wrist and forearm. These exercises also help to relieve pain, numbness, and tingling. These exercises are done using your healthy wrist and forearm to help stretch the muscles in your injured wrist and forearm. Exercise E: Wrist Flexion, Passive  1. Extend your left / right arm in front of you, relax your wrist, and point your fingers downward. 2. Gently push on the back of your hand. Stop when you feel a gentle stretch on the top of your forearm. 3. Hold this position for 30 seconds. Repeat 2 times. Complete this exercise 3 times per week. Exercise F: Wrist Extension, Passive  1. Extend your left / right arm in front of you and turn your palm upward. 2. Gently pull your palm and fingertips back so your fingers point downward. You should feel a gentle stretch on the palm-side of your forearm. 3. Hold this position for 30 seconds. Repeat 2 times. Complete this exercise 3 times per week. Exercise G: Forearm Rotation, Supination, Passive 1. Sit with your left / right elbow bent to an "L" shape (90 degrees) with your forearm resting on a table. 2. Keeping your upper body and shoulder still, use your other hand to rotate your forearm palm-up until you feel a  gentle to moderate stretch. 3. Hold this position for 30 seconds. 4. Slowly release the stretch and return to the starting position. Repeat 2 times. Complete this exercise 3 times per week. Exercise H: Forearm Rotation, Pronation, Passive 1. Sit with your left / right elbow bent to an "L" shape (90 degrees) with your forearm resting on a table. 2. Keeping your upper body and shoulder still, use your other hand to rotate your forearm palm-down until you feel a gentle to moderate stretch. 3. Hold this  position for 30 seconds. 4. Slowly release the stretch and return to the starting position. Repeat 2 times. Complete this exercise 3 times per week.  STRENGTHENING EXERCISES These exercises build strength and endurance in your wrist and forearm. Endurance is the ability to use your muscles for a long time, even after they get tired. Exercise I: Wrist Flexors  1. Sit with your left / right forearm supported on a table and your hand resting palm-up over the edge of the table. Your elbow should be bent to an "L" shape (about 90 degrees) and be below the level of your shoulder. 2. Hold a 3-5 lb weight in your left / right hand. Or, hold a rubber exercise band or tube in both hands, keeping your hands at the same level and hip distance apart. There should be a slight tension in the exercise band or tube. 3. Slowly curl your hand up toward your forearm. 4. Hold this position for 3 seconds. 5. Slowly lower your hand back to the starting position. Repeat 2 times. Complete this exercise 3 times per week. Exercise J: Wrist Extensors  1. Sit with your left / right forearm supported on a table and your hand resting palm-down over the edge of the table. Your elbow should be bent to an "L" shape (about 90 degrees) and be below the level of your shoulder. 2. Hold a 3-5 lb weight in your left / right hand. Or, hold a rubber exercise band or tube in both hands, keeping your hands at the same level and hip distance apart. There should be a slight tension in the exercise band or tube. 3. Slowly curl your hand up toward your forearm. 4. Hold this position for 3 seconds. 5. Slowly lower your hand back to the starting position. Repeat 2 times. Complete this exercise 3 times per week. Exercise K: Forearm Rotation, Supination  1. Sit with your left / right forearm supported on a table and your hand resting palm-down. Your elbow should be at your side, bent to an "L" shape (about 90 degrees), and below the level of  your shoulder. Keep your wrist stable and in a neutral position throughout the exercise. 2. Gently hold a lightweight hammer with your left / right hand. 3. Without moving your elbow or wrist, slowly rotate your palm upward to a thumbs-up position. 4. Hold this position for 3 seconds. 5. Slowly return your forearm to the starting position. Repeat 2 times. Complete this exercise 3 times per week. Exercise L: Forearm Rotation, Pronation  1. Sit with your left / right forearm supported on a table and your hand resting palm-up. Your elbow should be at your side, bent to an "L" shape (about 90 degrees), and below the level of your shoulder. Keep your wrist stable. Do not allow it to move backward or forward during the exercise. 2. Gently hold a lightweight hammer with your left / right hand. 3. Without moving your elbow or wrist, slowly rotate your  palm and hand upward to a thumbs-up position. 4. Hold this position for 3 seconds. 5. Slowly return your forearm to the starting position. Repeat 2 times. Complete this exercise 3 times per week. Exercise M: Grip Strengthening  1. Hold one of these items in your left / right hand: play dough, therapy putty, a dense sponge, a stress ball, or a large, rolled sock. 2. Squeeze as hard as you can without increasing pain. 3. Hold this position for 5 seconds. 4. Slowly release your grip. Repeat 2 times. Complete this exercise 3 times per week.  This information is not intended to replace advice given to you by your health care provider. Make sure you discuss any questions you have with your health care provider. Document Released: 11/21/2004 Document Revised: 10/02/2015 Document Reviewed: 10/02/2014 Elsevier Interactive Patient Education  [employer].

## 2019-03-30 NOTE — Progress Notes (Signed)
Chief Complaint  Patient presents with  . New Patient (Initial Visit)  . Hand Pain    nerve pain    Subjective Lacey Green is a 59 y.o. female who presents for hypertension follow up. She does monitor home blood pressures. Blood pressures ranging from 120-130's/70-80's on average. She is compliant with medication- losartan 25 mg/d Patient has these side effects of medication: none She is usually adhering to a healthy diet overall. 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 Radiates/shoots into hands. Compression gloves help. No neurologic s/s's.    Past Medical History:  Diagnosis Date  . Arthritis   . Headache(784.0)   . Hypertension     Review of Systems Cardiovascular: no chest pain Respiratory:  no shortness of breath  Exam BP 120/72 (BP Location: Left Arm, Patient Position: Sitting, Cuff Size: Large)   Pulse 90   Temp (!) 96.9 F (36.1 C) (Temporal)   Ht 5\' 4"  (1.626 m)   Wt 221 lb 2 oz (100.3 kg)   LMP 12/16/2013   SpO2 98%   BMI 37.96 kg/m  General:  well developed, well nourished, in no apparent distress Heart: RRR, no bruits, no LE edema Lungs: clear to auscultation, no accessory muscle use MSK: +TTP over flexor tendons over wrist b/l; no deformity or asymmetry; ROM intact; no bony ttp Neuro: Neg Phalen's and Tinel's Psych: well oriented with normal range of affect and appropriate judgment/insight  Essential hypertension - Plan: losartan (COZAAR) 25 MG tablet  Pain in both wrists  OAB (overactive bladder) - Plan: oxybutynin (DITROPAN-XL) 10 MG 24 hr tablet  1- Cont losartan 25 mg/d. Counseled on diet and exercise. 2- Wrist splints, stretches/exercises, ice/heat, Tylenol.  3- Trial above. Hold off of Myrbetriq 2/2 BP issues. Mind caffeine/EtOH.  F/u in 4 weeks to reck #3. The patient voiced understanding and agreement to the plan.  [place], DO 03/30/19  2:07 PM

## 2019-04-02 ENCOUNTER — Telehealth: Payer: Self-pay

## 2019-04-02 NOTE — Telephone Encounter (Signed)
Ok to wear at night. She could try lifting gloves for driving, or if she could wear a splint on just one wrist, I think that would still be helpful. Ty.

## 2019-04-02 NOTE — Telephone Encounter (Signed)
Advise on both messages

## 2019-04-02 NOTE — Telephone Encounter (Signed)
Pt wanted to add to this message that she wanted to try weight lifting gloves bcz of the padding.

## 2019-04-02 NOTE — Telephone Encounter (Signed)
Called both cell/home number no answer///no voice mail to leave a message

## 2019-04-02 NOTE — Telephone Encounter (Signed)
Pt called stating she saw Dr. Nani Ravens on 3/9 and was given hand splints to wear.  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 I messaged Shirlean Mylar and Shirlean Mylar said she can return them to the front desk and suggested I message Dr. Nani Ravens as to an alternative for the pt to try.

## 2019-04-05 NOTE — Telephone Encounter (Signed)
Called on both cell/home number no voice mail to leave a message

## 2019-04-06 NOTE — Telephone Encounter (Signed)
Called unable to speak to patient.

## 2019-04-14 ENCOUNTER — Other Ambulatory Visit: Payer: Self-pay | Admitting: Medical

## 2019-04-14 DIAGNOSIS — I10 Essential (primary) hypertension: Secondary | ICD-10-CM

## 2019-04-27 ENCOUNTER — Ambulatory Visit: Payer: 59 | Admitting: Family Medicine

## 2019-05-04 ENCOUNTER — Telehealth (INDEPENDENT_AMBULATORY_CARE_PROVIDER_SITE_OTHER): Payer: 59 | Admitting: Family Medicine

## 2019-05-04 ENCOUNTER — Encounter: Payer: Self-pay | Admitting: Family Medicine

## 2019-05-04 ENCOUNTER — Other Ambulatory Visit: Payer: Self-pay

## 2019-05-04 DIAGNOSIS — N3281 Overactive bladder: Secondary | ICD-10-CM | POA: Diagnosis not present

## 2019-05-04 DIAGNOSIS — Z7189 Other specified counseling: Secondary | ICD-10-CM

## 2019-05-04 DIAGNOSIS — Z7185 Encounter for immunization safety counseling: Secondary | ICD-10-CM

## 2019-05-04 NOTE — Progress Notes (Signed)
No chief complaint on file.   Subjective: Patient is a 59 y.o. female here for f/u OAB. Due to COVID-19 pandemic, we are interacting via web portal for an electronic face-to-face visit. I verified patient's ID using 2 identifiers. Patient agreed to proceed with visit via this method. Patient is at home, I am at office. Patient and I are present for visit.    ROS: Heart: Denies chest pain  Lungs: Denies SOB   Past Medical History:  Diagnosis Date  . Arthritis   . Headache(784.0)   . Hypertension     Objective: No conversational dyspnea Age appropriate judgment and insight Nml affect and mood  Assessment and Plan: OAB (overactive bladder)  Vaccine counseling  1- Cont original plan. Discussed low likelihood of angioedema. Will monitor and she will let us know if she has issues.  Follow-up in 6 weeks. 2-she asked me if she needs to have her platelets checked as she had the [employer] vaccine on 04/02/2019.  She has had no side effects such as swelling, or calf pain.  She is breathing fine.  I will reassure her at this time.  We do not need to check her platelets. The patient voiced understanding and agreement to the plan.  [place], DO 05/04/19  11:56 AM

## 2019-07-30 ENCOUNTER — Telehealth: Payer: Self-pay | Admitting: Gastroenterology

## 2019-07-30 NOTE — Telephone Encounter (Signed)
Pt is due for a Colonoscopy but it needs to be done at the hospital with Dr Bryan Lemma. Former Dr [name] pt.

## 2019-08-03 NOTE — Telephone Encounter (Signed)
I do not necessarily need to see her in clinic for a routine screening colonoscopy. But what is the indication to be done at [facility]?

## 2019-08-05 NOTE — Telephone Encounter (Signed)
Spoke to patient who states that she has established care with Dr Juanita Craver who did her last colonoscopy in 2017. She is not due for another one until 2022. Patient will continue her care with Dr Collene Mares.

## 2019-08-13 ENCOUNTER — Telehealth: Payer: Self-pay | Admitting: Family Medicine

## 2019-08-13 NOTE — Telephone Encounter (Signed)
Pt dropped off document to be filled out by provider (Health Examination Certificate 1 page) Pt would like to be called when document ready to pick up at [facility] [phone]. Document put at front office tray under providers name.

## 2019-08-17 NOTE — Telephone Encounter (Signed)
Called informed form completed She will pickup today

## 2020-06-27 ENCOUNTER — Telehealth: Payer: Self-pay

## 2020-06-27 NOTE — Telephone Encounter (Signed)
Pt is asking for a TOC from Dr. Nani Ravens to Lacey Green.  Please advise.  Thank you.

## 2020-06-27 NOTE — Telephone Encounter (Signed)
OK w me.  

## 2020-06-27 NOTE — Telephone Encounter (Signed)
Onida with me but have her schedule transfer of care/bp med check visit.

## 2020-06-28 ENCOUNTER — Ambulatory Visit: Payer: BC Managed Care – PPO | Admitting: Medical

## 2020-06-28 NOTE — Telephone Encounter (Signed)
Left message on pt's voice mail advising both providers were in agreement over the [facility] and that she could call the office back to schedule an appointment with Percell Miller.  Per Percell Miller, appt needs to be for a TOC/BP med check visit.

## 2020-06-29 NOTE — Telephone Encounter (Signed)
I made pt a TOC appt for 6/14.  You have seen her in the past and I don't think it will be a complicated visit.  She is needing a CPE in July so I did not want to prolong her TOC until August.  Pt was very appreciative of you taking her on and spoke highly of you seeing her in past visits.

## 2020-07-03 ENCOUNTER — Other Ambulatory Visit: Payer: Self-pay

## 2020-07-04 ENCOUNTER — Encounter: Payer: Self-pay | Admitting: Medical

## 2020-07-04 ENCOUNTER — Ambulatory Visit (HOSPITAL_BASED_OUTPATIENT_CLINIC_OR_DEPARTMENT_OTHER)
Admission: RE | Admit: 2020-07-04 | Discharge: 2020-07-04 | Disposition: A | Discharge status: Home / Self Care | Payer: BC Managed Care – PPO | Source: Ambulatory Visit | Attending: Medical | Admitting: Medical

## 2020-07-04 ENCOUNTER — Ambulatory Visit: Payer: BC Managed Care – PPO | Admitting: Medical

## 2020-07-04 VITALS — BP 135/80 | HR 70 | Temp 98.2°F | Resp 18 | Ht 64.0 in | Wt 229.0 lb

## 2020-07-04 DIAGNOSIS — I10 Essential (primary) hypertension: Secondary | ICD-10-CM | POA: Diagnosis not present

## 2020-07-04 DIAGNOSIS — R252 Cramp and spasm: Secondary | ICD-10-CM

## 2020-07-04 DIAGNOSIS — M79661 Pain in right lower leg: Secondary | ICD-10-CM

## 2020-07-04 DIAGNOSIS — G629 Polyneuropathy, unspecified: Secondary | ICD-10-CM | POA: Diagnosis not present

## 2020-07-04 DIAGNOSIS — M79662 Pain in left lower leg: Secondary | ICD-10-CM | POA: Insufficient documentation

## 2020-07-04 LAB — COMPREHENSIVE METABOLIC PANEL
ALT: 54 U/L — ABNORMAL HIGH (ref 0–35)
AST: 33 U/L (ref 0–37)
Albumin: 4.5 g/dL (ref 3.5–5.2)
Alkaline Phosphatase: 144 U/L — ABNORMAL HIGH (ref 39–117)
BUN: 17 mg/dL (ref 6–23)
CO2: 26 mEq/L (ref 19–32)
Calcium: 9.9 mg/dL (ref 8.4–10.5)
Chloride: 106 mEq/L (ref 96–112)
Creatinine, Ser: 0.86 mg/dL (ref 0.40–1.20)
GFR: 74.39 mL/min (ref >60.00)
Glucose, Bld: 87 mg/dL (ref 70–99)
Potassium: 4.5 mEq/L (ref 3.5–5.1)
Sodium: 141 mEq/L (ref 135–145)
Total Bilirubin: 0.6 mg/dL (ref 0.2–1.2)
Total Protein: 7.1 g/dL (ref 6.0–8.3)

## 2020-07-04 LAB — VITAMIN B12: Vitamin B-12: 581 pg/mL (ref 211–911)

## 2020-07-04 LAB — MAGNESIUM: Magnesium: 2.1 mg/dL (ref 1.5–2.5)

## 2020-07-04 MED ORDER — LOSARTAN POTASSIUM 25 MG PO TABS: 25.0000 mg | ORAL_TABLET | Freq: Every day | ORAL | 3 refills | Status: DC

## 2020-07-04 NOTE — Patient Instructions (Signed)
Your blood pressure does vary and mild high today. I think it is good idea to take the losartan 25 mg daily to keep bp in tight controlled range.  For cramping of calfs get cmp and mag level.  For nerve pain neuropathy type complaints get b12 and b1 level.  Calf pain with thigh pain. Will go ahead and get bilateral lower ext [place].  Follow up date or as needed.

## 2020-07-04 NOTE — Progress Notes (Signed)
Subjective:    Patient ID: Lacey Green, female    DOB: 04-30-1960, 60 y.o.   MRN: [id number]  HPI  Pt in for follow up.  I had seen pt more than a year ago.  Pt has hx of elevated blood pressure. Pt has hx of htn. I had written low dose losartan. Pt bp has fluctuated in the past. This morning was 117/88. Yesterday 134/79.    She has hx of some lower extremity cramps in past. Pt is taking magnesium over the counter.  Pt nerve pain twitching to feet at night. She thinks b1 vitamin helps.   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   Pt has bilateral upper thigh pain for months and recent bilateral calf pain.       Review of Systems  Constitutional:  Negative for chills, fatigue and fever.  HENT:  Negative for congestion.   Respiratory:  Negative for cough, chest tightness, wheezing and stridor.   Cardiovascular:  Negative for chest pain and palpitations.  Gastrointestinal:  Negative for abdominal pain, blood in stool, diarrhea and rectal pain.  Genitourinary:  Negative for dysuria.  Musculoskeletal:  Negative for back pain, joint swelling and myalgias.  Neurological:  Negative for dizziness, syncope, weakness, numbness and headaches.  Hematological:  Negative for adenopathy. Does not bruise/bleed easily.  Psychiatric/Behavioral:  Negative for behavioral problems and confusion.     Past Medical History:  Diagnosis Date   Arthritis    Headache(784.0)    Hypertension      Social History   Socioeconomic History   Marital status: Divorced    Spouse name: Not on file   Number of children: 3   Years of education: Not on file   Highest education level: Not on file  Occupational History    Employer: [employer]  Tobacco Use   Smoking status: Never   Smokeless tobacco: Never  Vaping Use   Vaping Use: Never used  Substance and Sexual Activity   Alcohol use: Yes    Comment: occasional   Drug use: No   Sexual activity: Not on file   Other Topics Concern   Not on file  Social History Narrative   Exercise: yes; except for the last 2 months.   Caffeine: 2 cups coffee daily.         Social Determinants of Health   Financial Resource Strain: Not on file  Food Insecurity: Not on file  Transportation Needs: Not on file  Physical Activity: Not on file  Stress: Not on file  Social Connections: Not on file  Intimate Partner Violence: Not on file    Past Surgical History:  Procedure Laterality Date   BUNIONECTOMY     WISDOM TOOTH EXTRACTION      Family History  Problem Relation Age of Onset   Hypertension Mother    Colon polyps Mother    Diabetes Father    Cancer Father        prostate   Cancer Maternal Aunt        ovarian   Cancer Paternal Uncle        stomach   Hypertension Maternal Grandmother    Arthritis Maternal Grandmother    Diabetes Maternal Grandfather    Heart disease Paternal Grandfather        MI   Cancer Paternal Grandmother        breast; left mastectomy    Allergies  Allergen Reactions   Penicillins  Current Outpatient Medications on File Prior to Visit  Medication Sig Dispense Refill   losartan (COZAAR) 25 MG tablet TAKE 1 TABLET(25 MG) BY MOUTH DAILY 30 tablet 5   No current facility-administered medications on file prior to visit.    BP (!) 130/92   Pulse 70   Temp 98.2 F (36.8 C)   Resp 18   Ht 5\' 4"  (1.626 m)   Wt 229 lb (103.9 kg)   LMP 12/16/2013   SpO2 96%   BMI 39.31 kg/m       Objective:   Physical Exam  General Mental Status- Alert. General Appearance- Not in acute distress.   Skin General: Color- Normal Color. Moisture- Normal Moisture.  Neck Carotid Arteries- Normal color. Moisture- Normal Moisture. No carotid bruits. No JVD.  Chest and Lung Exam Auscultation: Breath Sounds:-Normal.  Cardiovascular Auscultation:Rythm- Regular. Murmurs & Other Heart Sounds:Auscultation of the heart reveals- No  Murmurs.  Abdomen Inspection:-Inspeection Normal. Palpation/Percussion:Note:No mass. Palpation and Percussion of the abdomen reveal- Non Tender, Non Distended + BS, no rebound or guarding.    Neurologic Cranial Nerve exam:- CN III-XII intact(No nystagmus), symmetric smile. Strength:- 5/5 equal and symmetric strength both upper and lower extremities.   Lower ext- calf symmetric but mild + homans signs. Mild tender to palpation medial mid thighs.    Assessment & Plan:   Your blood pressure does vary and mild high today. I think it is good idea to take the losartan 25 mg daily to keep bp in tight controlled range.  For cramping of calfs get cmp and mag level.  For nerve pain neuropathy type complaints get b12 and b1 level.  Calf pain with thigh pain. Will go ahead and get bilateral lower ext [place].  Follow up date or as needed.  Mackie Pai, PA-C

## 2020-07-09 LAB — VITAMIN B1: Vitamin B1 (Thiamine): >1200 nmol/L — ABNORMAL HIGH (ref 8–30)

## 2020-07-27 ENCOUNTER — Encounter: Payer: BC Managed Care – PPO | Admitting: Medical

## 2020-11-03 ENCOUNTER — Encounter: Payer: BC Managed Care – PPO | Admitting: Medical

## 2021-01-24 ENCOUNTER — Encounter: Payer: BC Managed Care – PPO | Admitting: Medical

## 2021-02-05 ENCOUNTER — Encounter: Payer: Self-pay | Admitting: Medical

## 2021-02-05 ENCOUNTER — Ambulatory Visit (INDEPENDENT_AMBULATORY_CARE_PROVIDER_SITE_OTHER): Payer: BC Managed Care – PPO | Admitting: Medical

## 2021-02-05 VITALS — BP 122/80 | HR 77 | Temp 98.0°F | Resp 18 | Ht 64.0 in | Wt 224.0 lb

## 2021-02-05 DIAGNOSIS — R739 Hyperglycemia, unspecified: Secondary | ICD-10-CM | POA: Diagnosis not present

## 2021-02-05 DIAGNOSIS — Z113 Encounter for screening for infections with a predominantly sexual mode of transmission: Secondary | ICD-10-CM

## 2021-02-05 DIAGNOSIS — Z0000 Encounter for general adult medical examination without abnormal findings: Secondary | ICD-10-CM | POA: Diagnosis not present

## 2021-02-05 NOTE — Progress Notes (Signed)
Subjective:    Patient ID: Lacey Green, female    DOB: [birth year], 61 y.o.   MRN: [id number]  HPI  Pt in for wellness exam. She is fasting.  Pt works at [employer] school, pt has not been exercising but plans to exercise more. She states her diet has been ok. She is avoiding cakes and candy as that is her weak point. Nonsmoker. Red Wine 1 glass 3-4 days a week. She plans to cut back on alcohol use.  Pt stats she get pap every year.   Pt states she was told tells me her last colonoscopy was 5 years ago. She states she has contacted that office and she is not due yet. Done thru [facility].  Pt had last pap and mammogram done July 2022.   Pt will get covid booster at some point.   Review of Systems  Constitutional:  Negative for chills, fatigue and fever.  Respiratory:  Negative for choking, chest tightness and wheezing.   Cardiovascular:  Negative for chest pain and palpitations.  Gastrointestinal:  Negative for abdominal distention and anal bleeding.  Musculoskeletal:  Negative for back pain.  Skin:  Negative for rash.  Neurological:  Negative for dizziness and headaches.  Hematological:  Negative for adenopathy. Does not bruise/bleed easily.  Psychiatric/Behavioral:  Negative for behavioral problems and decreased concentration. The patient is not nervous/anxious.     Past Medical History:  Diagnosis Date   Arthritis    Headache(784.0)    Hypertension      Social History   Socioeconomic History   Marital status: Divorced    Spouse name: Not on file   Number of children: 3   Years of education: Not on file   Highest education level: Not on file  Occupational History    Employer: [employer]  Tobacco Use   Smoking status: Never   Smokeless tobacco: Never  Vaping Use   Vaping Use: Never used  Substance and Sexual Activity   Alcohol use: Yes    Comment: occasional   Drug use: No   Sexual activity: Not on file  Other Topics Concern   Not on file  Social History  Narrative   Exercise: yes; except for the last 2 months.   Caffeine: 2 cups coffee daily.         Social Determinants of Health   Financial Resource Strain: Not on file  Food Insecurity: Not on file  Transportation Needs: Not on file  Physical Activity: Not on file  Stress: Not on file  Social Connections: Not on file  Intimate Partner Violence: Not on file    Past Surgical History:  Procedure Laterality Date   BUNIONECTOMY     WISDOM TOOTH EXTRACTION      Family History  Problem Relation Age of Onset   Hypertension Mother    Colon polyps Mother    Diabetes Father    Cancer Father        prostate   Cancer Maternal Aunt        ovarian   Cancer Paternal Uncle        stomach   Hypertension Maternal Grandmother    Arthritis Maternal Grandmother    Diabetes Maternal Grandfather    Heart disease Paternal Grandfather        MI   Cancer Paternal Grandmother        breast; left mastectomy    Allergies  Allergen Reactions   Penicillins     Current Outpatient Medications on  File Prior to Visit  Medication Sig Dispense Refill   losartan (COZAAR) 25 MG tablet Take 1 tablet (25 mg total) by mouth daily. 90 tablet 3   losartan (COZAAR) 25 MG tablet TAKE 1 TABLET(25 MG) BY MOUTH DAILY 30 tablet 5   No current facility-administered medications on file prior to visit.    BP (!) 142/80    Pulse 77    Temp 98 F (36.7 C)    Resp 18    Ht 5\' 4"  (1.626 m)    Wt 224 lb (101.6 kg)    LMP 12/16/2013    SpO2 97%    BMI 38.45 kg/m       Objective:   Physical Exam  General Mental Status- Alert. General Appearance- Not in acute distress.   Skin General: Color- Normal Color. Moisture- Normal Moisture.  Neck Carotid Arteries- Normal color. Moisture- Normal Moisture. No carotid bruits. No JVD.  Chest and Lung Exam Auscultation: Breath Sounds:-Normal.  Cardiovascular Auscultation:Rythm- Regular. Murmurs & Other Heart Sounds:Auscultation of the heart reveals- No  Murmurs.  Abdomen Inspection:-Inspeection Normal. Palpation/Percussion:Note:No mass. Palpation and Percussion of the abdomen reveal- Non Tender, Non Distended + BS, no rebound or guarding.   Neurologic Cranial Nerve exam:- CN III-XII intact(No nystagmus), symmetric smile. Strength:- 5/5 equal and symmetric strength both upper and lower extremities.   Feet- rt foot hallux valgus. Both feet are flat.    Assessment & Plan:    For you wellness exam today I have ordered cbc, cmp , lipid panel and hiv.  Flu vaccine declined. You note will get covid booster. Can get downstairs. Also investigate with your insurance on if shingle vaccine covered.  Recommend exercise and healthy diet.  We will let you know lab results as they come in.  Follow up date appointment will be determined after lab review.    A1c added due to mild elevated sugar in past.  Mackie Pai, PA-C

## 2021-02-05 NOTE — Patient Instructions (Addendum)
For you wellness exam today I have ordered cbc, cmp , lipid panel and hiv.  Flu vaccine declined. You note will get covid booster. Can get downstairs. Also investigate with your insurance on if shingle vaccine covered.  Recommend exercise and healthy diet.  We will let you know lab results as they come in.  Follow up date appointment will be determined after lab review.    A1c added due to mild elevated sugar in past.   Preventive Care 24-60 Years Old, Female Preventive care refers to lifestyle choices and visits with your health care provider that can promote health and wellness. Preventive care visits are also called wellness exams. What can I expect for my preventive care visit? Counseling Your health care provider may ask you questions about your: Medical history, including: Past medical problems. Family medical history. Pregnancy history. Current health, including: Menstrual cycle. Method of birth control. Emotional well-being. Home life and relationship well-being. Sexual activity and sexual health. Lifestyle, including: Alcohol, nicotine or tobacco, and drug use. Access to firearms. Diet, exercise, and sleep habits. Work and work Statistician. Sunscreen use. Safety issues such as seatbelt and bike helmet use. Physical exam Your health care provider will check your: Height and weight. These may be used to calculate your BMI (body mass index). BMI is a measurement that tells if you are at a healthy weight. Waist circumference. This measures the distance around your waistline. This measurement also tells if you are at a healthy weight and may help predict your risk of certain diseases, such as type 2 diabetes and high blood pressure. Heart rate and blood pressure. Body temperature. Skin for abnormal spots. What immunizations do I need? Vaccines are usually given at various ages, according to a schedule. Your health care provider will recommend vaccines for you based on  your age, medical history, and lifestyle or other factors, such as travel or where you work. What tests do I need? Screening Your health care provider may recommend screening tests for certain conditions. This may include: Lipid and cholesterol levels. Diabetes screening. This is done by checking your blood sugar (glucose) after you have not eaten for a while (fasting). Pelvic exam and Pap test. Hepatitis B test. Hepatitis C test. HIV (human immunodeficiency virus) test. STI (sexually transmitted infection) testing, if you are at risk. Lung cancer screening. Colorectal cancer screening. Mammogram. Talk with your health care provider about when you should start having regular mammograms. This may depend on whether you have a family history of breast cancer. BRCA-related cancer screening. This may be done if you have a family history of breast, ovarian, tubal, or peritoneal cancers. Bone density scan. This is done to screen for osteoporosis. Talk with your health care provider about your test results, treatment options, and if necessary, the need for more tests. Follow these instructions at home: Eating and drinking  Eat a diet that includes fresh fruits and vegetables, whole grains, lean protein, and low-fat dairy products. Take vitamin and mineral supplements as recommended by your health care provider. Do not drink alcohol if: Your health care provider tells you not to drink. You are pregnant, may be pregnant, or are planning to become pregnant. If you drink alcohol: Limit how much you have to 0-1 drink a day. Know how much alcohol is in your drink. In the [place] one drink equals one 12 oz bottle of beer (355 mL), one 5 oz glass of wine (148 mL), or one 1 oz glass of hard liquor (44 mL). Lifestyle  Brush your teeth every morning and night with fluoride toothpaste. Floss one time each day. Exercise for at least 30 minutes 5 or more days each week. Do not use any products that contain  nicotine or tobacco. These products include cigarettes, chewing tobacco, and vaping devices, such as e-cigarettes. If you need help quitting, ask your health care provider. Do not use drugs. If you are sexually active, practice safe sex. Use a condom or other form of protection to prevent STIs. If you do not wish to become pregnant, use a form of birth control. If you plan to become pregnant, see your health care provider for a prepregnancy visit. Take aspirin only as told by your health care provider. Make sure that you understand how much to take and what form to take. Work with your health care provider to find out whether it is safe and beneficial for you to take aspirin daily. Find healthy ways to manage stress, such as: Meditation, yoga, or listening to music. Journaling. Talking to a trusted person. Spending time with friends and family. Minimize exposure to UV radiation to reduce your risk of skin cancer. Safety Always wear your seat belt while driving or riding in a vehicle. Do not drive: If you have been drinking alcohol. Do not ride with someone who has been drinking. When you are tired or distracted. While texting. If you have been using any mind-altering substances or drugs. Wear a helmet and other protective equipment during sports activities. If you have firearms in your house, make sure you follow all gun safety procedures. Seek help if you have been physically or sexually abused. What's next? Visit your health care provider once a year for an annual wellness visit. Ask your health care provider how often you should have your eyes and teeth checked. Stay up to date on all vaccines. This information is not intended to replace advice given to you by your health care provider. Make sure you discuss any questions you have with your health care provider. Document Revised: 07/05/2020 Document Reviewed: 07/05/2020 Elsevier Patient Education  [employer].

## 2021-02-06 LAB — COMPREHENSIVE METABOLIC PANEL
ALT: 26 U/L (ref 0–35)
AST: 18 U/L (ref 0–37)
Albumin: 4.5 g/dL (ref 3.5–5.2)
Alkaline Phosphatase: 127 U/L — ABNORMAL HIGH (ref 39–117)
BUN: 12 mg/dL (ref 6–23)
CO2: 26 mEq/L (ref 19–32)
Calcium: 9.9 mg/dL (ref 8.4–10.5)
Chloride: 104 mEq/L (ref 96–112)
Creatinine, Ser: 0.87 mg/dL (ref 0.40–1.20)
GFR: 73.06 mL/min (ref >60.00)
Glucose, Bld: 72 mg/dL (ref 70–99)
Potassium: 4 mEq/L (ref 3.5–5.1)
Sodium: 140 mEq/L (ref 135–145)
Total Bilirubin: 0.6 mg/dL (ref 0.2–1.2)
Total Protein: 6.9 g/dL (ref 6.0–8.3)

## 2021-02-06 LAB — HIV ANTIBODY (ROUTINE TESTING W REFLEX): HIV 1&2 Ab, 4th Generation: NONREACTIVE

## 2021-02-06 LAB — LIPID PANEL
Cholesterol: 220 mg/dL — ABNORMAL HIGH (ref 0–200)
HDL: 87.9 mg/dL (ref >39.00)
LDL Cholesterol: 120 mg/dL — ABNORMAL HIGH (ref 0–99)
NonHDL: 132.37
Total CHOL/HDL Ratio: 3
Triglycerides: 64 mg/dL (ref 0.0–149.0)
VLDL: 12.8 mg/dL (ref 0.0–40.0)

## 2021-02-06 LAB — CBC WITH DIFFERENTIAL/PLATELET
Basophils Absolute: 0.1 10*3/uL (ref 0.0–0.1)
Basophils Relative: 0.9 % (ref 0.0–3.0)
Eosinophils Absolute: 0.1 10*3/uL (ref 0.0–0.7)
Eosinophils Relative: 1.7 % (ref 0.0–5.0)
HCT: 42.4 % (ref 36.0–46.0)
Hemoglobin: 14.1 g/dL (ref 12.0–15.0)
Lymphocytes Relative: 48.5 % — ABNORMAL HIGH (ref 12.0–46.0)
Lymphs Abs: 2.8 10*3/uL (ref 0.7–4.0)
MCHC: 33.3 g/dL (ref 30.0–36.0)
MCV: 91.5 fl (ref 78.0–100.0)
Monocytes Absolute: 0.4 10*3/uL (ref 0.1–1.0)
Monocytes Relative: 6.2 % (ref 3.0–12.0)
Neutro Abs: 2.5 10*3/uL (ref 1.4–7.7)
Neutrophils Relative %: 42.7 % — ABNORMAL LOW (ref 43.0–77.0)
Platelets: 198 10*3/uL (ref 150.0–400.0)
RBC: 4.64 Mil/uL (ref 3.87–5.11)
RDW: 13 % (ref 11.5–15.5)
WBC: 5.8 10*3/uL (ref 4.0–10.5)

## 2021-02-06 LAB — HEMOGLOBIN A1C: Hgb A1c MFr Bld: 5.9 % (ref 4.6–6.5)

## 2021-02-07 MED ORDER — ATORVASTATIN CALCIUM 10 MG PO TABS: 10.0000 mg | ORAL_TABLET | Freq: Every day | ORAL | 3 refills | Status: DC

## 2021-02-07 NOTE — Addendum Note (Signed)
Addended by: Anabel Halon on: 02/07/2021 09:10 AM   Modules accepted: Orders

## 2021-04-04 ENCOUNTER — Telehealth: Payer: Self-pay | Admitting: Gastroenterology

## 2021-04-04 NOTE — Telephone Encounter (Signed)
Hi Dr. Bryan Lemma, ? ?Spoke with patient she is requesting to transfer her care back to you from Dr. Lorie Apley office. According to the telephone note from 2021, it states that the patient was advised to follow up with Dr. Lorie Apley office for her colonoscopy in 2022. The patient does not wish to go back to Dr. Lorie Apley office due to preference. Obtained 2017 operative report.  ?

## 2021-05-01 ENCOUNTER — Telehealth: Payer: Self-pay | Admitting: Medical

## 2021-05-01 DIAGNOSIS — I10 Essential (primary) hypertension: Secondary | ICD-10-CM

## 2021-05-01 NOTE — Telephone Encounter (Signed)
Pt stated she needs lipid panel labs. No orders.  ? ?Please advise.  ?

## 2021-05-01 NOTE — Telephone Encounter (Signed)
Labs placed and lab appt made  ?

## 2021-05-03 ENCOUNTER — Other Ambulatory Visit (INDEPENDENT_AMBULATORY_CARE_PROVIDER_SITE_OTHER): Payer: BC Managed Care – PPO

## 2021-05-03 ENCOUNTER — Ambulatory Visit: Payer: BC Managed Care – PPO | Admitting: Medical

## 2021-05-03 DIAGNOSIS — I10 Essential (primary) hypertension: Secondary | ICD-10-CM

## 2021-05-03 LAB — LIPID PANEL
Cholesterol: 226 mg/dL — ABNORMAL HIGH (ref 0–200)
HDL: 87.3 mg/dL (ref >39.00)
LDL Cholesterol: 127 mg/dL — ABNORMAL HIGH (ref 0–99)
NonHDL: 138.98
Total CHOL/HDL Ratio: 3
Triglycerides: 62 mg/dL (ref 0.0–149.0)
VLDL: 12.4 mg/dL (ref 0.0–40.0)

## 2021-05-04 ENCOUNTER — Telehealth: Payer: Self-pay | Admitting: Gastroenterology

## 2021-05-04 NOTE — Telephone Encounter (Signed)
Received a limited copy of the prior colonoscopy report, completed by Dr. Collene Mares on 02/06/2015 for rectosigmoid polyp (pathology unknown) recommendation to repeat colonoscopy in 5 years for ongoing surveillance.  This was a limited copy which only had grainy photographs and recommendations, but otherwise not a full description of the procedure itself, to include size of the polyp and resection technique.  No pathology available for review either. ? ?Prior to that colonoscopy in 12/2007 by Dr. [name] with normal-appearing colon. ? ?Per recommendation from previous Endoscopist, okay to schedule direct for colonoscopy.  If she is having active GI symptoms, recommend scheduling an OV first. ?

## 2021-05-05 MED ORDER — ATORVASTATIN CALCIUM 20 MG PO TABS: 20.0000 mg | ORAL_TABLET | Freq: Every day | ORAL | 3 refills | Status: DC

## 2021-05-07 NOTE — Telephone Encounter (Signed)
Left message for pt to call back  °

## 2021-05-07 NOTE — Telephone Encounter (Signed)
IF sxs were self limiting and now resolved, ok to schedule direct for procedure. If sxs ongoing/recurrent, then yes, I agree with OV first.  ?

## 2021-05-07 NOTE — Telephone Encounter (Signed)
I do not see anything in the chart that should prompt this procedure to be done at [facility].  Based on what I can see, [facility] should be fine. ?

## 2021-05-07 NOTE — Telephone Encounter (Signed)
Called pt and gave pt Dr. Vivia Ewing message. Pt states she had some abd pain and bleeding with bowel movements in March. Attempted to schedule pt for soonest available office appt but pt states she would not be able to have an appt until June because of school. Pt is scheduled for office appt on 06/26/21 at 1:20 pm.  ?

## 2021-05-07 NOTE — Telephone Encounter (Signed)
Spoke with pt and pt stated that symptoms are now resolved. OV canceled. Pt scheduled for colonoscopy on 06/27/21 at 10 am. Pre-visit scheduled for 05/28/21 at 10:30 am. Pt verbalized understanding and had no other concerns at end of call.  ?

## 2021-05-28 ENCOUNTER — Ambulatory Visit (AMBULATORY_SURGERY_CENTER): Payer: BC Managed Care – PPO | Admitting: *Deleted

## 2021-05-28 VITALS — Ht 64.0 in | Wt 221.0 lb

## 2021-05-28 DIAGNOSIS — Z86010 Personal history of colonic polyps: Secondary | ICD-10-CM

## 2021-05-28 MED ORDER — CLENPIQ 10-3.5-12 MG-GM -GM/160ML PO SOLN: 1.0000 | ORAL | 0 refills | Status: DC

## 2021-05-28 NOTE — Progress Notes (Signed)
No egg or soy allergy known to patient  ?No issues known to pt with past sedation with any surgeries or procedures ?Patient denies ever being told they had issues or difficulty with intubation  ?No FH of Malignant Hyperthermia ?Pt is not on diet pills ?Pt is not on  home 02  ?Pt is not on blood thinners  ?Pt denies issues with constipation  ?No A fib or A flutter ? ?Clenpiq Coupon to pt in PV today , Code to Pharmacy and  NO PA's for preps discussed with pt In PV today  ?Discussed with pt there will be an out-of-pocket cost for prep and that varies from $0 to 70 +  dollars - pt verbalized understanding  ?Pt instructed to use Singlecare.com or GoodRx for a price reduction on prep  ? ?PV completed over the phone. Pt verified name, DOB, address and insurance during PV today.  ?Pt mailed instruction packet with copy of consent form to read and not return, and instructions.  ?Pt encouraged to call with questions or issues.  ?If pt has My chart, procedure instructions sent via My Chart  ?Insurance confirmed with pt at [facility] today  ? ?

## 2021-06-24 ENCOUNTER — Encounter: Payer: Self-pay | Admitting: Certified Registered Nurse Anesthetist

## 2021-06-25 ENCOUNTER — Encounter: Payer: Self-pay | Admitting: Gastroenterology

## 2021-06-26 ENCOUNTER — Ambulatory Visit: Payer: BC Managed Care – PPO | Admitting: Gastroenterology

## 2021-06-27 ENCOUNTER — Encounter: Payer: Self-pay | Admitting: Gastroenterology

## 2021-06-27 ENCOUNTER — Ambulatory Visit (AMBULATORY_SURGERY_CENTER): Payer: BC Managed Care – PPO | Admitting: Gastroenterology

## 2021-06-27 VITALS — BP 101/68 | HR 73 | Temp 98.9°F | Resp 12 | Ht 64.0 in | Wt 221.0 lb

## 2021-06-27 DIAGNOSIS — D122 Benign neoplasm of ascending colon: Secondary | ICD-10-CM | POA: Diagnosis not present

## 2021-06-27 DIAGNOSIS — K5730 Diverticulosis of large intestine without perforation or abscess without bleeding: Secondary | ICD-10-CM

## 2021-06-27 DIAGNOSIS — Z86010 Personal history of colonic polyps: Secondary | ICD-10-CM | POA: Diagnosis not present

## 2021-06-27 DIAGNOSIS — Z09 Encounter for follow-up examination after completed treatment for conditions other than malignant neoplasm: Secondary | ICD-10-CM

## 2021-06-27 DIAGNOSIS — D128 Benign neoplasm of rectum: Secondary | ICD-10-CM | POA: Diagnosis not present

## 2021-06-27 DIAGNOSIS — D120 Benign neoplasm of cecum: Secondary | ICD-10-CM | POA: Diagnosis not present

## 2021-06-27 DIAGNOSIS — D123 Benign neoplasm of transverse colon: Secondary | ICD-10-CM

## 2021-06-27 DIAGNOSIS — K640 First degree hemorrhoids: Secondary | ICD-10-CM

## 2021-06-27 MED ORDER — SODIUM CHLORIDE 0.9 % IV SOLN: 500.0000 mL | Freq: Once | INTRAVENOUS | Status: DC

## 2021-06-27 NOTE — Progress Notes (Signed)
GASTROENTEROLOGY PROCEDURE H&P NOTE   Primary Care Physician: Mackie Pai, PA-C    Reason for Procedure:  Colon polyp surveillance  Plan:    Colonoscopy  Patient is appropriate for endoscopic procedure(s) in the ambulatory ([facility]) setting.  The nature of the procedure, as well as the risks, benefits, and alternatives were carefully and thoroughly reviewed with the patient. Ample time for discussion and questions allowed. The patient understood, was satisfied, and agreed to proceed.     HPI: Lacey Green is a 61 y.o. female who presents for ongoing colon polyp surveillance.  No active GI symptoms.    Family history notable for mother with colon polyps which required surgical resection.  Maternal grandmother with colon polyps.  Endoscopic History: - Colonoscopy (Dr. Collene Mares on 02/06/2015): Only limited report available which was n/f rectosigmoid polyp (pathology unknown) recommendation to repeat colonoscopy in 5 years for ongoing surveillance.   -Colonoscopy (12/2007 by Dr. [name]): normal-appearing colon.  Past Medical History:  Diagnosis Date   Arthritis    Headache(784.0)    Hypertension     Past Surgical History:  Procedure Laterality Date   BUNIONECTOMY     COLONOSCOPY     02-06-2015   LOWER EXTREMITY ANGIOGRAM     POLYPECTOMY     TUBAL LIGATION     WISDOM TOOTH EXTRACTION      Prior to Admission medications   Medication Sig Start Date End Date Taking? Authorizing Provider  Arginine 1000 MG TABS Take by mouth.   Yes [provider]  Ascorbic Acid (VITAMIN C) 1000 MG tablet Take 1,000 mg by mouth daily.   Yes [provider]  MAGNESIUM PO Take by mouth.   Yes [provider]  NIACIN PO Take by mouth.   Yes [provider]  thiamine 100 MG tablet Take by mouth.   Yes [provider]  TURMERIC PO Take by mouth.   Yes [provider]  atorvastatin (LIPITOR) 20 MG tablet Take 1 tablet (20 mg total) by mouth  daily. 05/05/21   Saguier, Percell Miller, PA-C  CALCIUM-VITAMIN D-VITAMIN K PO Take by mouth.    [provider]  losartan (COZAAR) 25 MG tablet Take 25 mg by mouth daily. Doesn't use it daily- takes it [facility] -    [provider]  naproxen (NAPROSYN) 375 MG tablet Take by mouth.    [provider]    Current Outpatient Medications  Medication Sig Dispense Refill   Arginine 1000 MG TABS Take by mouth.     Ascorbic Acid (VITAMIN C) 1000 MG tablet Take 1,000 mg by mouth daily.     MAGNESIUM PO Take by mouth.     NIACIN PO Take by mouth.     thiamine 100 MG tablet Take by mouth.     TURMERIC PO Take by mouth.     atorvastatin (LIPITOR) 20 MG tablet Take 1 tablet (20 mg total) by mouth daily. 90 tablet 3   CALCIUM-VITAMIN D-VITAMIN K PO Take by mouth.     losartan (COZAAR) 25 MG tablet Take 25 mg by mouth daily. Doesn't use it daily- takes it [facility] -     naproxen (NAPROSYN) 375 MG tablet Take by mouth.     Current Facility-Administered Medications  Medication Dose Route Frequency Provider Last Rate Last Admin   0.9 %  sodium chloride infusion  500 mL Intravenous Once Mechell Girgis V, DO        Allergies as of 06/27/2021 - Review Complete 06/27/2021  Allergen Reaction Noted   Penicillins  02/06/2007    Family History  Problem Relation Age of Onset   Hypertension Mother    Colon polyps Mother        12 inches of colon removed   Prostate cancer Father    Diabetes Father    Cancer Father        prostate   Ovarian cancer Maternal Aunt    Cancer Maternal Aunt        ovarian   Stomach cancer Paternal Uncle    Cancer Paternal Uncle        stomach   Colon polyps Maternal Grandmother    Hypertension Maternal Grandmother    Arthritis Maternal Grandmother    Diabetes Maternal Grandfather    Cancer Paternal Grandmother        breast; left mastectomy   Heart disease Paternal Grandfather        MI   Colon cancer Neg Hx    Esophageal cancer Neg Hx    Rectal cancer  Neg Hx     Social History   Socioeconomic History   Marital status: Divorced    Spouse name: Not on file   Number of children: 3   Years of education: Not on file   Highest education level: Not on file  Occupational History    Employer: [employer]  Tobacco Use   Smoking status: Never   Smokeless tobacco: Never  Vaping Use   Vaping Use: Never used  Substance and Sexual Activity   Alcohol use: Yes    Comment: occasional   Drug use: No   Sexual activity: Not on file  Other Topics Concern   Not on file  Social History Narrative   Exercise: yes; except for the last 2 months.   Caffeine: 2 cups coffee daily.         Social Determinants of Health   Financial Resource Strain: Not on file  Food Insecurity: Not on file  Transportation Needs: Not on file  Physical Activity: Not on file  Stress: Not on file  Social Connections: Not on file  Intimate Partner Violence: Not on file    Physical Exam: Vital signs in last 24 hours: '@BP'$  137/88   Pulse 75   Temp 98.9 F (37.2 C)   Ht '5\' 4"'$  (1.626 m)   Wt 221 lb (100.2 kg)   LMP 12/16/2013   SpO2 100%   BMI 37.93 kg/m  GEN: NAD EYE: Sclerae anicteric ENT: MMM CV: Non-tachycardic Pulm: CTA b/l GI: Soft, NT/ND NEURO:  Alert & Oriented x 3   Gerrit Heck, DO [facility]   06/27/2021 9:40 AM

## 2021-06-27 NOTE — Patient Instructions (Signed)
Handouts on polyps, hemorrhoids, and diverticulosis given to you today  YOU HAD AN ENDOSCOPIC PROCEDURE TODAY AT [facility]:   Refer to the procedure report that was given to you for any specific questions about what was found during the examination.  If the procedure report does not answer your questions, please call your gastroenterologist to clarify.  If you requested that your care partner not be given the details of your procedure findings, then the procedure report has been included in a sealed envelope for you to review at your convenience later.  YOU SHOULD EXPECT: Some feelings of bloating in the abdomen. Passage of more gas than usual.  Walking can help get rid of the air that was put into your GI tract during the procedure and reduce the bloating. If you had a lower endoscopy (such as a colonoscopy or flexible sigmoidoscopy) you may notice spotting of blood in your stool or on the toilet paper. If you underwent a bowel prep for your procedure, you may not have a normal bowel movement for a few days.  Please Note:  You might notice some irritation and congestion in your nose or some drainage.  This is from the oxygen used during your procedure.  There is no need for concern and it should clear up in a day or so.  SYMPTOMS TO REPORT IMMEDIATELY:  Following lower endoscopy (colonoscopy or flexible sigmoidoscopy):  Excessive amounts of blood in the stool  Significant tenderness or worsening of abdominal pains  Swelling of the abdomen that is new, acute  Fever of 100F or higher   For urgent or emergent issues, a gastroenterologist can be reached at any hour by calling [phone]. Do not use MyChart messaging for urgent concerns.    DIET:  We do recommend a small meal at first, but then you may proceed to your regular diet.  Drink plenty of fluids but you should avoid alcoholic beverages for 24 hours.  ACTIVITY:  You should plan to take it easy for the rest of today  and you should NOT DRIVE or use heavy machinery until tomorrow (because of the sedation medicines used during the test).    FOLLOW UP: Our staff will call the number listed on your records 24-72 hours following your procedure to check on you and address any questions or concerns that you may have regarding the information given to you following your procedure. If we do not reach you, we will leave a message.  We will attempt to reach you two times.  During this call, we will ask if you have developed any symptoms of COVID 19. If you develop any symptoms (ie: fever, flu-like symptoms, shortness of breath, cough etc.) before then, please call [phone].  If you test positive for Covid 19 in the 2 weeks post procedure, please call and report this information to [place].    If any biopsies were taken you will be contacted by phone or by letter within the next 1-3 weeks.  Please call us at [phone] if you have not heard about the biopsies in 3 weeks.    SIGNATURES/CONFIDENTIALITY: You and/or your care partner have signed paperwork which will be entered into your electronic medical record.  These signatures attest to the fact that that the information above on your After Visit Summary has been reviewed and is understood.  Full responsibility of the confidentiality of this discharge information lies with you and/or your care-partner.

## 2021-06-27 NOTE — Op Note (Signed)
[facility] Patient Name: Lacey Green Procedure Date: 06/27/2021 9:30 AM MRN: [id number] Endoscopist: Gerrit Heck , MD Age: 61 Referring MD:  Date of Birth: 07-21-1960 Gender: Female Account #: [id number] Procedure:                Colonoscopy Indications:              Surveillance: Personal history of colonic polyps                            (unknown histology) on last colonoscopy 5 years ago                            at outside facility.                           Family history notable for mother with colon polyp                            requiring surgical resection and MGM with colon                            polyps. Medicines:                Monitored Anesthesia Care Procedure:                Pre-Anesthesia Assessment:                           - Prior to the procedure, a History and Physical                            was performed, and patient medications and                            allergies were reviewed. The patient's tolerance of                            previous anesthesia was also reviewed. The risks                            and benefits of the procedure and the sedation                            options and risks were discussed with the patient.                            All questions were answered, and informed consent                            was obtained. Prior Anticoagulants: The patient has                            taken no previous anticoagulant or antiplatelet  agents. ASA Grade Assessment: II - A patient with                            mild systemic disease. After reviewing the risks                            and benefits, the patient was deemed in                            satisfactory condition to undergo the procedure.                           After obtaining informed consent, the colonoscope                            was passed under direct vision. Throughout the                             procedure, the patient's blood pressure, pulse, and                            oxygen saturations were monitored continuously. The                            Olympus CF-HQ190L ([id number]) Colonoscope was                            introduced through the anus and advanced to the the                            terminal ileum. The colonoscopy was performed                            without difficulty. The patient tolerated the                            procedure well. The quality of the bowel                            preparation was good. The terminal ileum, ileocecal                            valve, appendiceal orifice, and rectum were                            photographed. Scope In: 9:47:42 AM Scope Out: 10:07:27 AM Scope Withdrawal Time: 0 hours 16 minutes 55 seconds  Total Procedure Duration: 0 hours 19 minutes 45 seconds  Findings:                 The perianal and digital rectal examinations were                            normal.  Two sessile polyps were found in the transverse                            colon and cecum. The polyps were 2 to 4 mm in size.                            These polyps were removed with a cold snare.                            Resection and retrieval were complete. Estimated                            blood loss was minimal.                           Two sessile polyps were found in the rectum. The                            polyps were 3 to 4 mm in size. These polyps were                            removed with a cold snare. Resection and retrieval                            were complete. Estimated blood loss was minimal.                           A few small-mouthed diverticula were found in the                            sigmoid colon.                           The terminal ileum appeared normal.                           Non-bleeding internal hemorrhoids were found during                            retroflexion. The  hemorrhoids were small and Grade                            I (internal hemorrhoids that do not prolapse). Complications:            No immediate complications. Estimated Blood Loss:     Estimated blood loss was minimal. Impression:               - Two 2 to 4 mm polyps in the transverse colon and                            in the cecum, removed with a cold snare. Resected  and retrieved.                           - Two 3 to 4 mm polyps in the rectum, removed with                            a cold snare. Resected and retrieved.                           - Diverticulosis in the sigmoid colon.                           - The examined portion of the ileum was normal.                           - Non-bleeding internal hemorrhoids. Recommendation:           - Patient has a contact number available for                            emergencies. The signs and symptoms of potential                            delayed complications were discussed with the                            patient. Return to normal activities tomorrow.                            Written discharge instructions were provided to the                            patient.                           - Resume previous diet.                           - Continue present medications.                           - Await pathology results.                           - Repeat colonoscopy for surveillance based on                            pathology results.                           - Return to GI office PRN.                           - Internal hemorrhoids were noted on this study and                            may be amenable  to hemorrhoid band ligation. If you                            are interested in further treatment of these                            hemorrhoids with band ligation, please contact my                            clinic to set up an appointment for evaluation and                             treatment. Gerrit Heck, MD 06/27/2021 10:13:04 AM

## 2021-06-27 NOTE — Progress Notes (Signed)
Report given to PACU, vss 

## 2021-06-27 NOTE — Progress Notes (Signed)
Pt's states no medical or surgical changes since previsit or office visit. 

## 2021-06-28 ENCOUNTER — Telehealth: Payer: Self-pay | Admitting: *Deleted

## 2021-06-28 NOTE — Telephone Encounter (Signed)
  Follow up Call-     06/27/2021    9:30 AM  Call back number  Post procedure Call Back phone  # [phone]  Permission to leave phone message Yes     Patient questions:  Do you have a fever, pain , or abdominal swelling? No. Pain Score  0 *  Have you tolerated food without any problems? Yes.    Have you been able to return to your normal activities? Yes.    Do you have any questions about your discharge instructions: Diet   No. Medications  No. Follow up visit  No.  Do you have questions or concerns about your Care? No.  Actions: * If pain score is 4 or above: No action needed, pain <4.

## 2021-07-04 ENCOUNTER — Encounter: Payer: Self-pay | Admitting: Gastroenterology

## 2021-07-05 ENCOUNTER — Telehealth: Payer: Self-pay | Admitting: Gastroenterology

## 2021-07-05 NOTE — Telephone Encounter (Signed)
Left message on machine to call back  

## 2021-07-09 NOTE — Telephone Encounter (Unsigned)
Spoke with pt who stated that about 3 days after procedure she started having dull abd pain and difficulty passing stool. Patient states that symptoms have since resolved but pt wanted to know if this was normal after colonoscopy.

## 2021-07-09 NOTE — Telephone Encounter (Signed)
Yes, that can be seen not uncommonly after colonoscopy. Please call our office if sxs recur or bowel habits not returning to baseline.

## 2021-07-10 NOTE — Telephone Encounter (Signed)
Spoke with pt and gave pt Dr. Cirigliano's message. Pt verbalized understanding and had no other concerns at end of call.  

## 2021-08-13 ENCOUNTER — Ambulatory Visit: Payer: BC Managed Care – PPO | Admitting: Medical

## 2021-08-14 ENCOUNTER — Ambulatory Visit: Payer: BC Managed Care – PPO | Admitting: Medical

## 2021-08-14 ENCOUNTER — Encounter: Payer: Self-pay | Admitting: Medical

## 2021-08-14 VITALS — BP 120/82 | Temp 98.6°F | Resp 18 | Ht 64.0 in | Wt 219.0 lb

## 2021-08-14 DIAGNOSIS — R739 Hyperglycemia, unspecified: Secondary | ICD-10-CM

## 2021-08-14 DIAGNOSIS — I10 Essential (primary) hypertension: Secondary | ICD-10-CM | POA: Diagnosis not present

## 2021-08-14 DIAGNOSIS — Z23 Encounter for immunization: Secondary | ICD-10-CM

## 2021-08-14 DIAGNOSIS — R748 Abnormal levels of other serum enzymes: Secondary | ICD-10-CM

## 2021-08-14 DIAGNOSIS — E785 Hyperlipidemia, unspecified: Secondary | ICD-10-CM

## 2021-08-14 NOTE — Progress Notes (Signed)
Subjective:    Patient ID: Lacey Green, female    DOB: 07-11-1960, 61 y.o.   MRN: [id number]  HPI  Pt in for evaluation.   Pt mentioned hep c maintenance section stated  is recommended. So she put that as reason no history hep c. But not sexual partner with hep c and no history of iv drug use. No fever, no chills, no sweats or body aches. No liver enzyme elevation except mild elevated liver enzymes.   Pt had colonoscopy this summer. Told to repeat study in 3-4 years. She has colon polyp and diverticulosis.   Family history of colon polyp.    Appears does need tetanus. Last one per epic in 2012  Pt declines shingrix vaccine.  Pt will get covid vaccine. No prior significant side effects  Pt on losartan 25 mg daily.  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 Pt states her bp without med has been 132/82 and she was not on medication.   Mild elevated sugar by a1c. Not in diabetic.  Pt cholesterol is elevated mild-moderate.   The 10-year ASCVD risk score (Arnett DK, et al., 2019) is: 4.4%   Values used to calculate the score:     Age: 55 years     Sex: Female     Is Non-Hispanic African American: Yes     Diabetic: No     Tobacco smoker: No     Systolic Blood Pressure: 627 mmHg     Is BP treated: Yes     HDL Cholesterol: 87.3 mg/dL     Total Cholesterol: 226 mg/dL     Review of Systems  Constitutional:  Negative for chills, fatigue and fever.  HENT:  Negative for congestion and drooling.   Respiratory:  Negative for cough, chest tightness, shortness of breath and wheezing.   Cardiovascular:  Negative for chest pain and palpitations.  Gastrointestinal:  Negative for abdominal pain and constipation.  Genitourinary:  Negative for dyspareunia, dysuria, enuresis and flank pain.  Musculoskeletal:  Negative for back pain.  Skin:  Negative for rash.    Past Medical History:  Diagnosis Date   Arthritis    Headache(784.0)    Hypertension       Social History   Socioeconomic History   Marital status: Divorced    Spouse name: Not on file   Number of children: 3   Years of education: Not on file   Highest education level: Not on file  Occupational History    Employer: [employer]  Tobacco Use   Smoking status: Never   Smokeless tobacco: Never  Vaping Use   Vaping Use: Never used  Substance and Sexual Activity   Alcohol use: Yes    Comment: occasional   Drug use: No   Sexual activity: Not on file  Other Topics Concern   Not on file  Social History Narrative   Exercise: yes; except for the last 2 months.   Caffeine: 2 cups coffee daily.         Social Determinants of Health   Financial Resource Strain: Not on file  Food Insecurity: Not on file  Transportation Needs: Not on file  Physical Activity: Not on file  Stress: Not on file  Social Connections: Not on file  Intimate Partner Violence: Not on file    Past Surgical History:  Procedure Laterality Date   BUNIONECTOMY     COLONOSCOPY     02-06-2015   LOWER EXTREMITY ANGIOGRAM  POLYPECTOMY     TUBAL LIGATION     WISDOM TOOTH EXTRACTION      Family History  Problem Relation Age of Onset   Hypertension Mother    Colon polyps Mother        12 inches of colon removed   Prostate cancer Father    Diabetes Father    Cancer Father        prostate   Ovarian cancer Maternal Aunt    Cancer Maternal Aunt        ovarian   Stomach cancer Paternal Uncle    Cancer Paternal Uncle        stomach   Colon polyps Maternal Grandmother    Hypertension Maternal Grandmother    Arthritis Maternal Grandmother    Diabetes Maternal Grandfather    Cancer Paternal Grandmother        breast; left mastectomy   Heart disease Paternal Grandfather        MI   Colon cancer Neg Hx    Esophageal cancer Neg Hx    Rectal cancer Neg Hx     Allergies  Allergen Reactions   Penicillins     Current Outpatient Medications on File Prior to Visit  Medication Sig Dispense Refill    Arginine 1000 MG TABS Take by mouth.     Ascorbic Acid (VITAMIN C) 1000 MG tablet Take 1,000 mg by mouth daily.     atorvastatin (LIPITOR) 20 MG tablet Take 1 tablet (20 mg total) by mouth daily. 90 tablet 3   CALCIUM-VITAMIN D-VITAMIN K PO Take by mouth.     losartan (COZAAR) 25 MG tablet Take 25 mg by mouth daily. Doesn't use it daily- takes it OCC -     MAGNESIUM PO Take by mouth.     naproxen (NAPROSYN) 375 MG tablet Take by mouth.     NIACIN PO Take by mouth.     thiamine 100 MG tablet Take by mouth.     TURMERIC PO Take by mouth.     No current facility-administered medications on file prior to visit.    BP 120/82   Temp 98.6 F (37 C)   Resp 18   Ht '5\' 4"'$  (1.626 m)   Wt 219 lb (99.3 kg)   LMP 12/16/2013   SpO2 99%   BMI 37.59 kg/m        Objective:   Physical Exam  General- No acute distress. Pleasant patient. Neck- Full range of motion, no jvd Lungs- Clear, even and unlabored. Heart- regular rate and rhythm. Neurologic- CNII- XII grossly intact.       Assessment & Plan:   Patient Instructions  If you want hep c study/antibody study can order but with your lack of risk factors do not think required. Some insurance if not high risk may not cover.  Will repeat cmp today to check liver enzyme and to follow kidney function.  High cholesterol with lower risk score of 4%. Recommend low cholesterol diet. If risk score approaches 8% recommend medications or further studies to evaluate risk in more detail.  The 10-year ASCVD risk score (Arnett DK, et al., 2019) is: 4.4%   Values used to calculate the score:     Age: 47 years     Sex: Female     Is Non-Hispanic African American: Yes     Diabetic: No     Tobacco smoker: No     Systolic Blood Pressure: 161 mmHg     Is BP treated: Yes  HDL Cholesterol: 87.3 mg/dL     Total Cholesterol: 226 mg/dL   Mild elevated sugar levels. Mild prediabetic in the past.  Will get tdap up date.   High bp in the past.  Check bp 1-2 times every 2 weeks. If symptomatic check. You bp recently controlled in 130/80 without losartan.  Follow up in 3 months or sooner if needed               [employer], PA-C

## 2021-08-14 NOTE — Addendum Note (Signed)
Addended by: Jeronimo Greaves on: 08/14/2021 03:35 PM   Modules accepted: Orders

## 2021-08-14 NOTE — Patient Instructions (Addendum)
If you want hep c study/antibody study can order but with your lack of risk factors do not think required. Some insurance if not high risk may not cover.  Will repeat cmp today to check liver enzyme and to follow kidney function.  High cholesterol with lower risk score of 4%. Recommend low cholesterol diet. If risk score approaches 8% recommend medications or further studies to evaluate risk in more detail.    The 10-year ASCVD risk score (Arnett DK, et al., 2019) is: 4.4%   Values used to calculate the score:     Age: 61 years     Sex: Female     Is Non-Hispanic African American: Yes     Diabetic: No     Tobacco smoker: No     Systolic Blood Pressure: 374 mmHg     Is BP treated: Yes     HDL Cholesterol: 87.3 mg/dL     Total Cholesterol: 226 mg/dL   Mild elevated sugar levels. Mild prediabetic in the past.  Will get tdap up date.   High bp in the past. Check bp 1-2 times every 2 weeks. If symptomatic check. You bp recently controlled in 130/80 without losartan.  Follow up in 3 months or sooner if needed

## 2021-08-20 ENCOUNTER — Other Ambulatory Visit (INDEPENDENT_AMBULATORY_CARE_PROVIDER_SITE_OTHER): Payer: BC Managed Care – PPO

## 2021-08-20 DIAGNOSIS — I10 Essential (primary) hypertension: Secondary | ICD-10-CM

## 2021-08-20 DIAGNOSIS — R739 Hyperglycemia, unspecified: Secondary | ICD-10-CM | POA: Diagnosis not present

## 2021-08-20 DIAGNOSIS — E785 Hyperlipidemia, unspecified: Secondary | ICD-10-CM | POA: Diagnosis not present

## 2021-08-20 LAB — COMPREHENSIVE METABOLIC PANEL
ALT: 77 U/L — ABNORMAL HIGH (ref 0–35)
AST: 40 U/L — ABNORMAL HIGH (ref 0–37)
Albumin: 4.3 g/dL (ref 3.5–5.2)
Alkaline Phosphatase: 128 U/L — ABNORMAL HIGH (ref 39–117)
BUN: 11 mg/dL (ref 6–23)
CO2: 27 mEq/L (ref 19–32)
Calcium: 9.8 mg/dL (ref 8.4–10.5)
Chloride: 109 mEq/L (ref 96–112)
Creatinine, Ser: 0.87 mg/dL (ref 0.40–1.20)
GFR: 72.79 mL/min (ref >60.00)
Glucose, Bld: 85 mg/dL (ref 70–99)
Potassium: 4.2 mEq/L (ref 3.5–5.1)
Sodium: 143 mEq/L (ref 135–145)
Total Bilirubin: 0.7 mg/dL (ref 0.2–1.2)
Total Protein: 6.6 g/dL (ref 6.0–8.3)

## 2021-08-20 LAB — HEMOGLOBIN A1C: Hgb A1c MFr Bld: 5.8 % (ref 4.6–6.5)

## 2021-08-20 LAB — LIPID PANEL
Cholesterol: 217 mg/dL — ABNORMAL HIGH (ref 0–200)
HDL: 92.1 mg/dL (ref >39.00)
LDL Cholesterol: 112 mg/dL — ABNORMAL HIGH (ref 0–99)
NonHDL: 124.62
Total CHOL/HDL Ratio: 2
Triglycerides: 63 mg/dL (ref 0.0–149.0)
VLDL: 12.6 mg/dL (ref 0.0–40.0)

## 2021-08-20 NOTE — Addendum Note (Signed)
Addended by: Anabel Halon on: 08/20/2021 08:09 PM   Modules accepted: Orders

## 2021-08-24 ENCOUNTER — Other Ambulatory Visit (INDEPENDENT_AMBULATORY_CARE_PROVIDER_SITE_OTHER): Payer: BC Managed Care – PPO

## 2021-08-24 ENCOUNTER — Ambulatory Visit (HOSPITAL_BASED_OUTPATIENT_CLINIC_OR_DEPARTMENT_OTHER)
Admission: RE | Admit: 2021-08-24 | Discharge: 2021-08-24 | Disposition: A | Discharge status: Home / Self Care | Payer: BC Managed Care – PPO | Source: Ambulatory Visit | Attending: Medical | Admitting: Medical

## 2021-08-24 ENCOUNTER — Telehealth: Payer: BC Managed Care – PPO | Admitting: Physician Assistant

## 2021-08-24 ENCOUNTER — Encounter: Payer: Self-pay | Admitting: Medical

## 2021-08-24 DIAGNOSIS — R748 Abnormal levels of other serum enzymes: Secondary | ICD-10-CM

## 2021-08-24 NOTE — Progress Notes (Signed)
Patient wanting to know if any fatty streaks were noted, not just that the parnechyma was within normal limits. Advised to message her PCP for further questioning as they may have to contact the reading radiologist for this. She is just wanting to know if anything is even starting since her liver enzymes were mildly elevated. Visit marked no charge.

## 2021-08-24 NOTE — Patient Instructions (Signed)
  Lynetta Mare, thank you for joining Mar Daring, PA-C for today's virtual visit.  While this provider is not your primary care provider (PCP), if your PCP is located in our provider database this encounter information will be shared with them immediately following your visit.  Consent: (Patient) Lacey Green provided verbal consent for this virtual visit at the beginning of the encounter.  Current Medications:  Current Outpatient Medications:    Arginine 1000 MG TABS, Take by mouth., Disp: , Rfl:    Ascorbic Acid (VITAMIN C) 1000 MG tablet, Take 1,000 mg by mouth daily., Disp: , Rfl:    atorvastatin (LIPITOR) 20 MG tablet, Take 1 tablet (20 mg total) by mouth daily., Disp: 90 tablet, Rfl: 3   CALCIUM-VITAMIN D-VITAMIN K PO, Take by mouth., Disp: , Rfl:    losartan (COZAAR) 25 MG tablet, Take 25 mg by mouth daily. Doesn't use it daily- takes it OCC -, Disp: , Rfl:    MAGNESIUM PO, Take by mouth., Disp: , Rfl:    NIACIN PO, Take by mouth., Disp: , Rfl:    thiamine 100 MG tablet, Take by mouth., Disp: , Rfl:    TURMERIC PO, Take by mouth., Disp: , Rfl:    Medications ordered in this encounter:  No orders of the defined types were placed in this encounter.    *If you need refills on other medications prior to your next appointment, please contact your pharmacy*  Follow-Up: Call back or seek an in-person evaluation if the symptoms worsen or if the condition fails to improve as anticipated.   If you have been instructed to have an in-person evaluation today at a local Urgent Care facility, please use the link below. It will take you to a list of all of our available [facility], including address, phone number and hours of operation. Please do not delay care.  [facility]  If you or a family member do not have a primary care provider, use the link below to schedule a visit and establish care. When you choose a [facility] [place] [facility] primary care physician or  advanced practice provider, you gain a long-term partner in health. Find a Primary Care Provider  Learn more about [facility] in-office and virtual care options: [facility]

## 2021-08-27 LAB — HEPATITIS B SURFACE ANTIBODY,QUALITATIVE: Hep B S Ab: REACTIVE — AB

## 2021-08-27 LAB — HEPATITIS A ANTIBODY, TOTAL: Hepatitis A AB,Total: NONREACTIVE

## 2021-08-27 LAB — HEPATITIS B CORE ANTIBODY, TOTAL: Hep B Core Total Ab: NONREACTIVE

## 2021-08-27 LAB — HEPATITIS C ANTIBODY: Hepatitis C Ab: NONREACTIVE

## 2021-08-27 LAB — HEPATITIS B E ANTIBODY: Hep B E Ab: NONREACTIVE

## 2021-09-01 IMAGING — US US EXTREM LOW VENOUS
1 series · 13 of 24 positions shown · non-contrast
Comparison: None.

CLINICAL DATA: 59-year-old female with a history of leg pain



[Series 1: us extrem low venous · 13 of 73 slices shown]
[im 1/73]
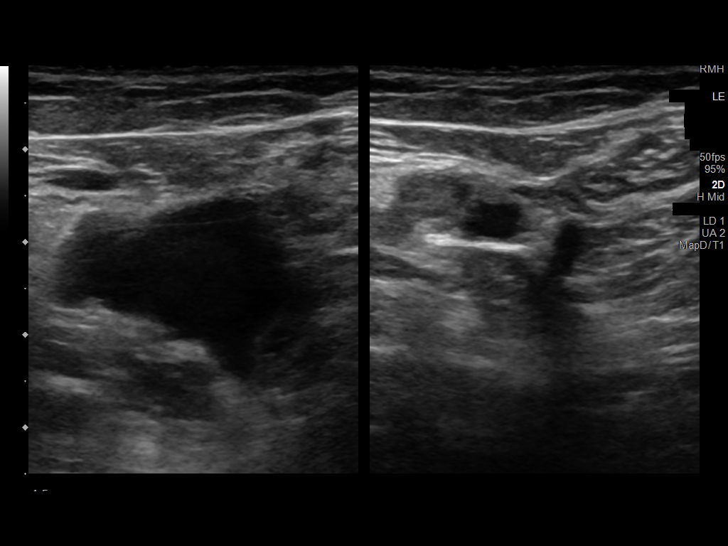
[im 7/73]
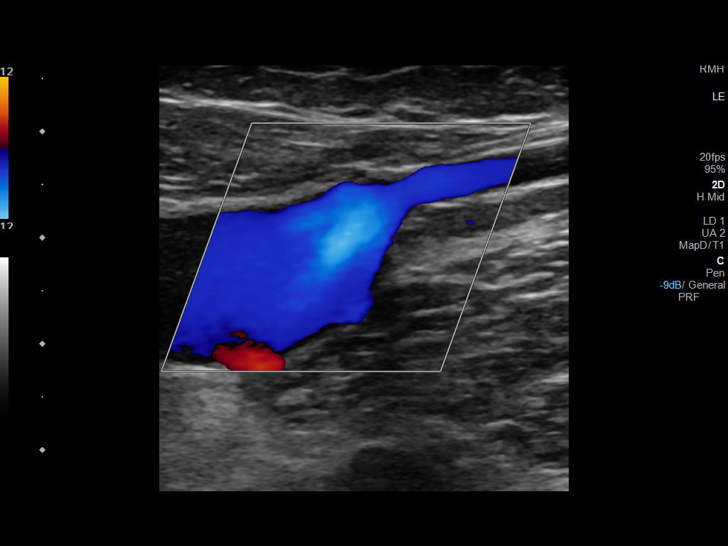
[im 13/73]
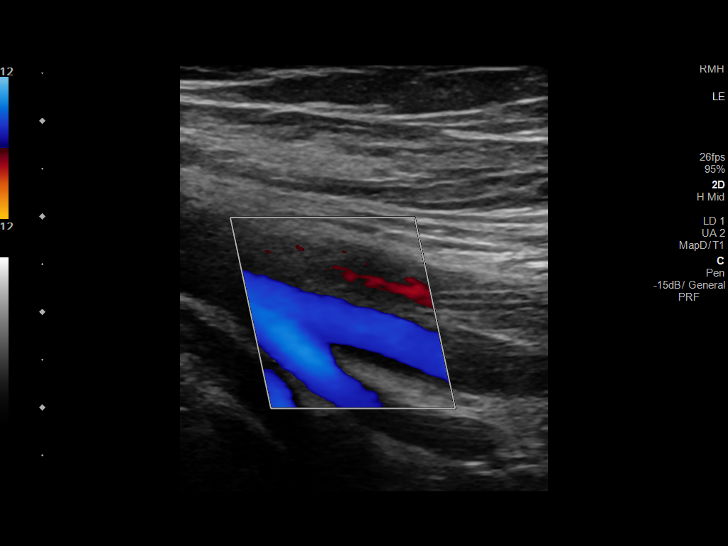
[im 19/73]
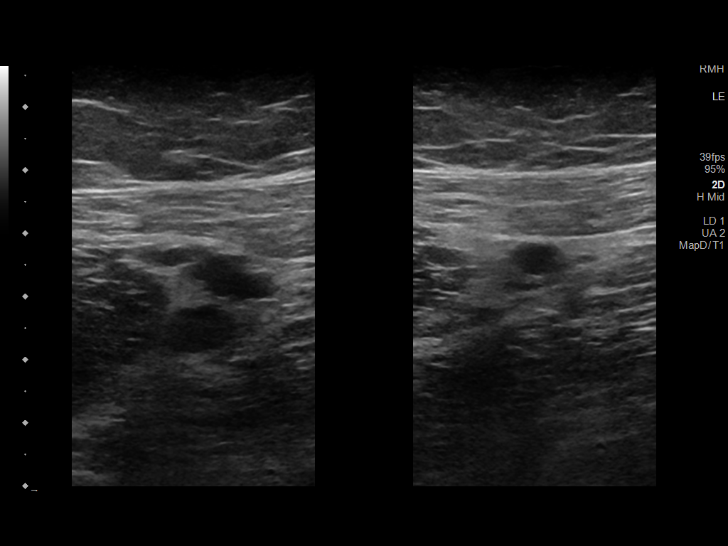
[im 26/73]
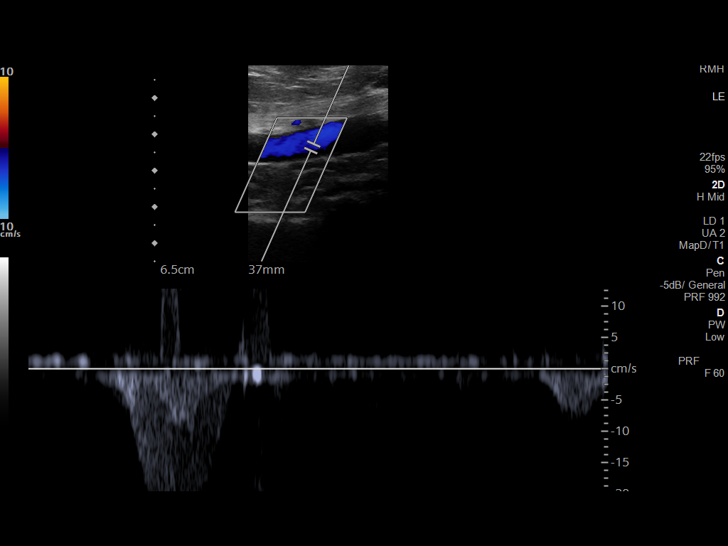
[im 32/73]
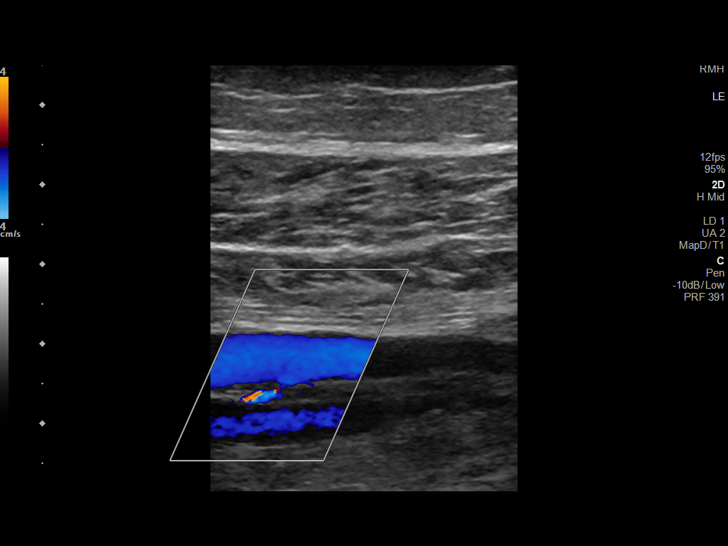
[im 38/73]
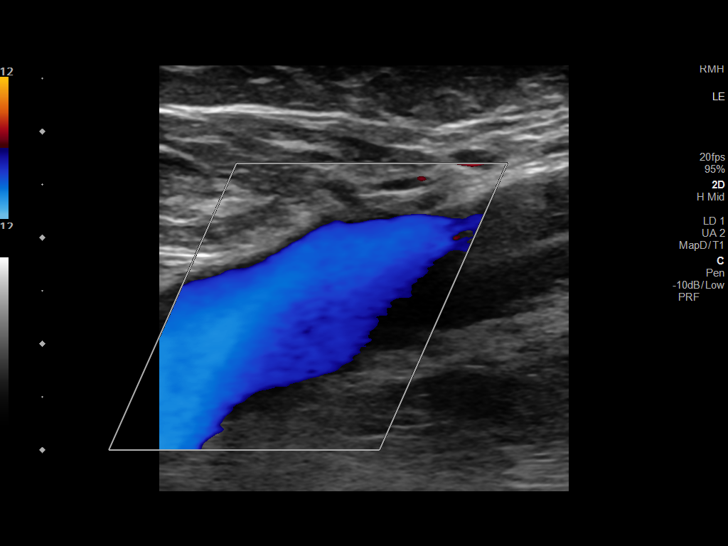
[im 41/73]
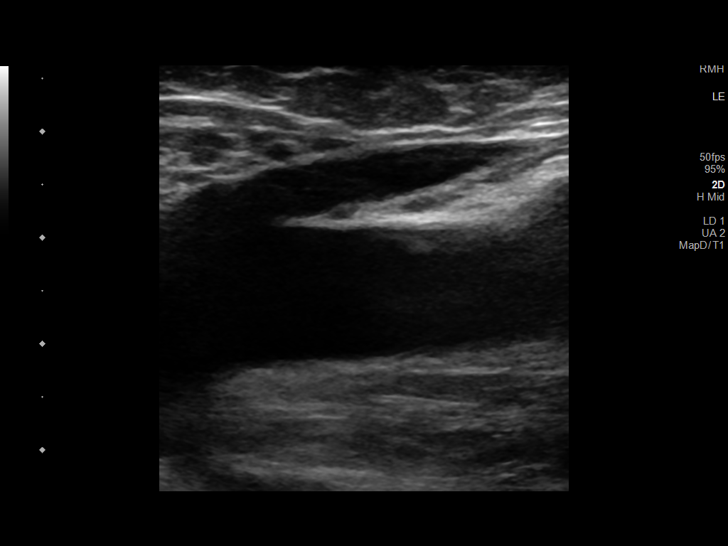
[im 47/73]
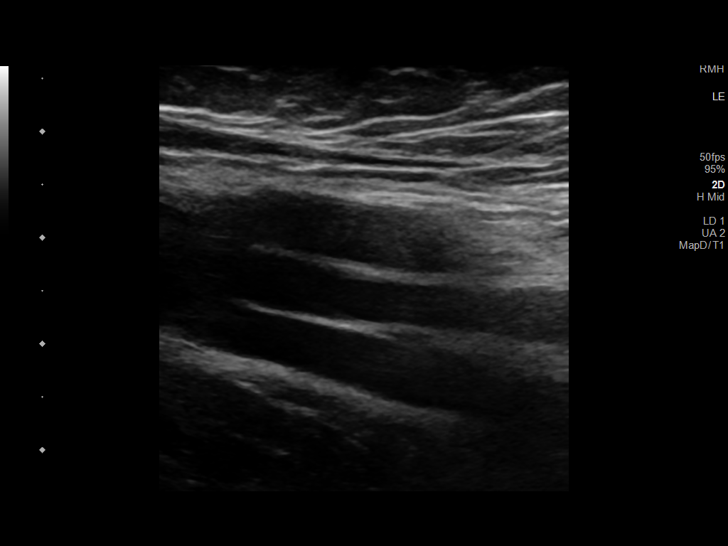
[im 54/73]
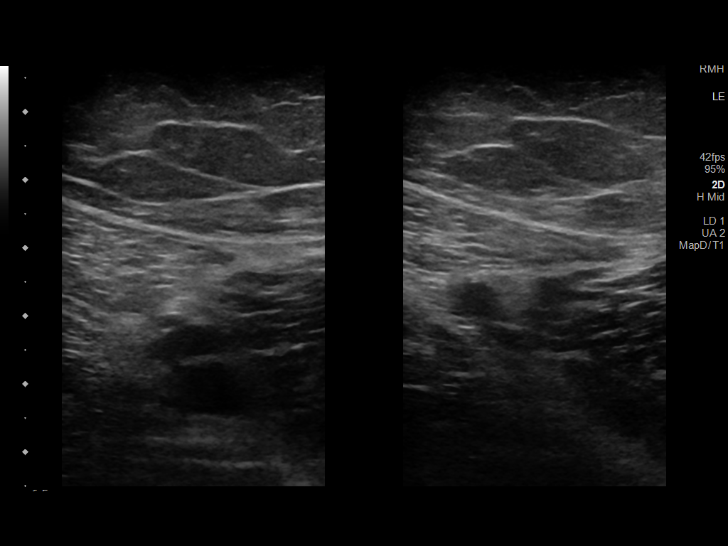
[im 60/73]
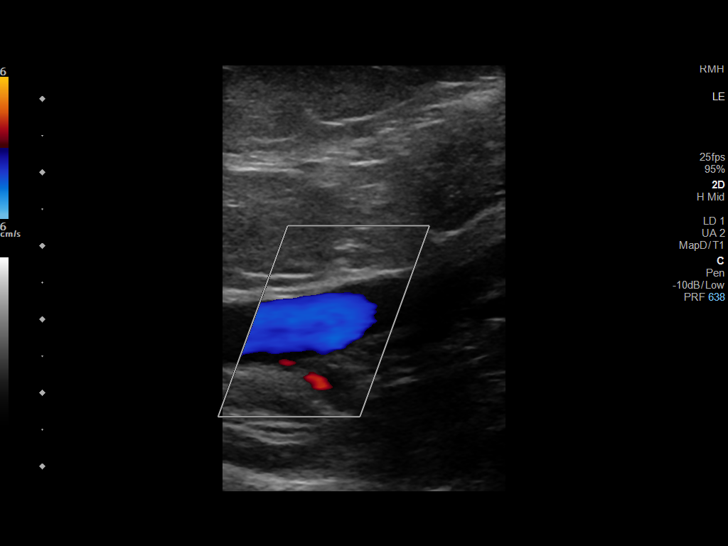
[im 66/73]
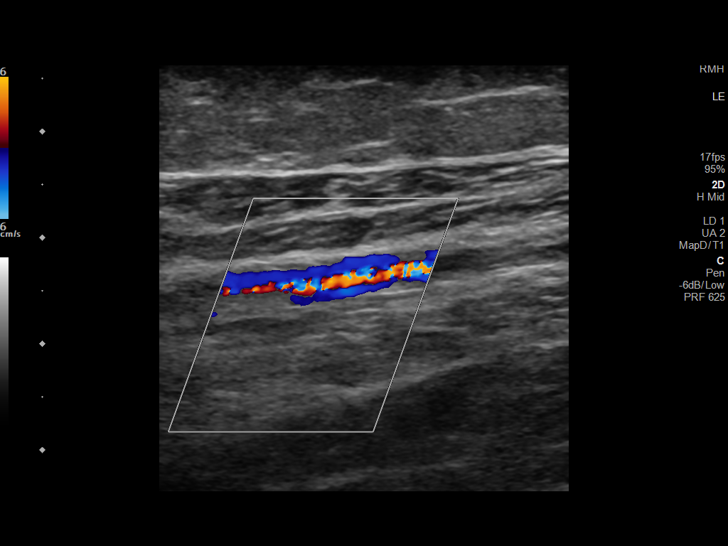
[im 73/73]
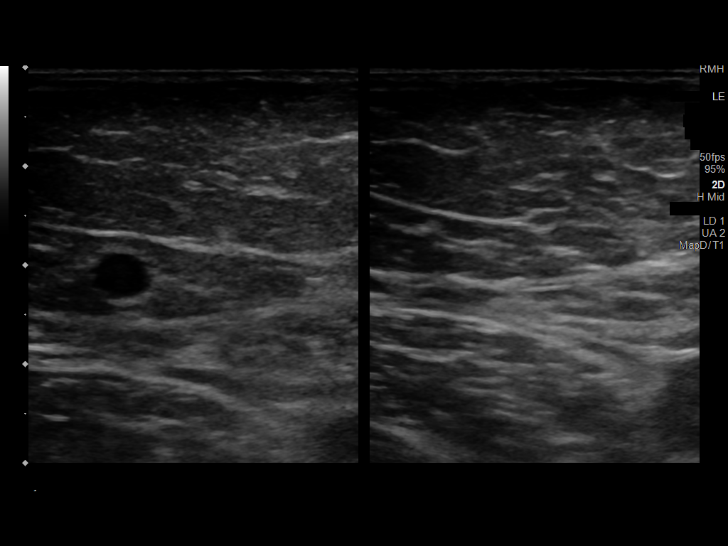

[13 of 24 positions shown; findings below may reference images not displayed]

FINDINGS: RIGHT LOWER EXTREMITY

Common Femoral Vein: No evidence of thrombus. Normal
compressibility, respiratory phasicity and response to augmentation.

Saphenofemoral Junction: No evidence of thrombus. Normal
compressibility and flow on color Doppler imaging.

Profunda Femoral Vein: No evidence of thrombus. Normal
compressibility and flow on color Doppler imaging.

Femoral Vein: No evidence of thrombus. Normal compressibility,
respiratory phasicity and response to augmentation.

Popliteal Vein: No evidence of thrombus. Normal compressibility,
respiratory phasicity and response to augmentation.

Calf Veins: No evidence of thrombus. Normal compressibility and flow
on color Doppler imaging.

Superficial Great Saphenous Vein: No evidence of thrombus. Normal
compressibility and flow on color Doppler imaging.

Other Findings:  None.

LEFT LOWER EXTREMITY

Common Femoral Vein: No evidence of thrombus. Normal
compressibility, respiratory phasicity and response to augmentation.

Saphenofemoral Junction: No evidence of thrombus. Normal
compressibility and flow on color Doppler imaging.

Profunda Femoral Vein: No evidence of thrombus. Normal
compressibility and flow on color Doppler imaging.

Femoral Vein: No evidence of thrombus. Normal compressibility,
respiratory phasicity and response to augmentation.

Popliteal Vein: No evidence of thrombus. Normal compressibility,
respiratory phasicity and response to augmentation.

Calf Veins: No evidence of thrombus. Normal compressibility and flow
on color Doppler imaging.

Superficial Great Saphenous Vein: No evidence of thrombus. Normal
compressibility and flow on color Doppler imaging.

Other Findings:  None.
IMPRESSION: Sonographic survey of the bilateral lower extremities negative for
DVT

## 2022-03-25 ENCOUNTER — Other Ambulatory Visit: Payer: Self-pay | Admitting: Medical

## 2022-03-25 ENCOUNTER — Other Ambulatory Visit: Payer: Self-pay | Admitting: *Deleted

## 2022-03-25 ENCOUNTER — Encounter: Payer: Self-pay | Admitting: *Deleted

## 2022-03-25 MED ORDER — LOSARTAN POTASSIUM 25 MG PO TABS: 25.0000 mg | ORAL_TABLET | Freq: Every day | ORAL | 0 refills | Status: DC

## 2022-03-29 ENCOUNTER — Other Ambulatory Visit: Payer: Self-pay

## 2022-04-01 ENCOUNTER — Encounter: Payer: Self-pay | Admitting: Medical

## 2022-04-10 ENCOUNTER — Encounter: Payer: Self-pay | Admitting: *Deleted

## 2022-04-27 ENCOUNTER — Other Ambulatory Visit: Payer: Self-pay | Admitting: Medical

## 2022-06-28 ENCOUNTER — Other Ambulatory Visit (HOSPITAL_BASED_OUTPATIENT_CLINIC_OR_DEPARTMENT_OTHER): Payer: Self-pay

## 2022-06-28 ENCOUNTER — Encounter (HOSPITAL_BASED_OUTPATIENT_CLINIC_OR_DEPARTMENT_OTHER): Payer: Self-pay

## 2022-06-28 ENCOUNTER — Other Ambulatory Visit: Payer: Self-pay

## 2022-06-28 ENCOUNTER — Emergency Department (HOSPITAL_BASED_OUTPATIENT_CLINIC_OR_DEPARTMENT_OTHER)
Admission: EM | Admit: 2022-06-28 | Discharge: 2022-06-28 | Disposition: A | Discharge status: Home / Self Care | Payer: BC Managed Care – PPO | Attending: Emergency Medicine | Admitting: Emergency Medicine

## 2022-06-28 DIAGNOSIS — I10 Essential (primary) hypertension: Secondary | ICD-10-CM | POA: Insufficient documentation

## 2022-06-28 DIAGNOSIS — M546 Pain in thoracic spine: Secondary | ICD-10-CM | POA: Diagnosis present

## 2022-06-28 LAB — BASIC METABOLIC PANEL
Anion gap: 6 (ref 5–15)
BUN: 10 mg/dL (ref 6–20)
CO2: 25 mmol/L (ref 22–32)
Calcium: 9.4 mg/dL (ref 8.9–10.3)
Chloride: 109 mmol/L (ref 98–111)
Creatinine, Ser: 0.81 mg/dL (ref 0.44–1.00)
GFR, Estimated: >60 mL/min (ref >60)
Glucose, Bld: 102 mg/dL — ABNORMAL HIGH (ref 70–99)
Potassium: 3.9 mmol/L (ref 3.5–5.1)
Sodium: 140 mmol/L (ref 135–145)

## 2022-06-28 LAB — CBC WITH DIFFERENTIAL/PLATELET
Abs Immature Granulocytes: 0 10*3/uL (ref 0.00–0.07)
Basophils Absolute: 0 10*3/uL (ref 0.0–0.1)
Basophils Relative: 1 %
Eosinophils Absolute: 0.1 10*3/uL (ref 0.0–0.5)
Eosinophils Relative: 3 %
HCT: 40.1 % (ref 36.0–46.0)
Hemoglobin: 13.9 g/dL (ref 12.0–15.0)
Immature Granulocytes: 0 %
Lymphocytes Relative: 52 %
Lymphs Abs: 2.3 10*3/uL (ref 0.7–4.0)
MCH: 31.1 pg (ref 26.0–34.0)
MCHC: 34.7 g/dL (ref 30.0–36.0)
MCV: 89.7 fL (ref 80.0–100.0)
Monocytes Absolute: 0.3 10*3/uL (ref 0.1–1.0)
Monocytes Relative: 8 %
Neutro Abs: 1.6 10*3/uL — ABNORMAL LOW (ref 1.7–7.7)
Neutrophils Relative %: 36 %
Platelets: 191 10*3/uL (ref 150–400)
RBC: 4.47 MIL/uL (ref 3.87–5.11)
RDW: 12.8 % (ref 11.5–15.5)
WBC: 4.4 10*3/uL (ref 4.0–10.5)
nRBC: 0 % (ref 0.0–0.2)

## 2022-06-28 LAB — URINALYSIS, ROUTINE W REFLEX MICROSCOPIC
Bilirubin Urine: NEGATIVE
Glucose, UA: NEGATIVE mg/dL
Hgb urine dipstick: NEGATIVE
Ketones, ur: 15 mg/dL — AB
Leukocytes,Ua: NEGATIVE
Nitrite: NEGATIVE
Protein, ur: NEGATIVE mg/dL
Specific Gravity, Urine: 1.016 (ref 1.005–1.030)
pH: 5.5 (ref 5.0–8.0)

## 2022-06-28 NOTE — ED Triage Notes (Signed)
"  My blood pressure has been high for about a week and I have pain in my right lower back that feels like a pinch" per pt

## 2022-06-28 NOTE — Discharge Instructions (Addendum)
Please read and follow all provided instructions.  Your diagnoses today include:  1. Acute right-sided thoracic back pain   2. Hypertension, unspecified type     Tests performed today include: Blood cell counts and electrolytes, kidney function: No problems Urine test: No signs of infection Vital signs. See below for your results today.   Medications prescribed:  None  Take any prescribed medications only as directed.  Home care instructions:  Follow any educational materials contained in this packet.  Use home medications, over-the-counter medications, or heating pad in the area on your back for symptom control.  Please take your blood pressure occasion daily and follow-up with your primary care doctor regarding long-term need for blood pressure medication.  Also if you are having intermittent blood noted in the stool, this may need to be further evaluated.  Please bring this up with your primary care doctor.  Follow-up instructions: Please follow-up with your primary care provider in the next 7 days for further evaluation of your symptoms.   Return instructions:  Please return to the Emergency Department if you experience worsening symptoms.  Please return if you have any other emergent concerns.  Additional Information:  Your vital signs today were: BP (!) 145/96   Temp 98.2 F (36.8 C) (Oral)   Resp 17   Ht 5\' 4"  (1.626 m)   Wt 99.8 kg   LMP 12/16/2013   SpO2 99%   BMI 37.76 kg/m  If your blood pressure (BP) was elevated above 135/85 this visit, please have this repeated by your doctor within one month. --------------

## 2022-06-28 NOTE — ED Provider Notes (Signed)
[facility] AT [facility] Provider Note   CSN: [id number] Arrival date & time: 06/28/22  1023     History  Chief Complaint  Patient presents with   Back Pain    Lacey Green is a 62 y.o. female.  Patient with history of hyper tension presents to the emergency department for evaluation of of elevated blood pressure, headache which is a chronic problem for the patient, and a "pinch" in her right middle back.  She states that her blood pressure has been running high for about a week.  She was on blood pressure medication that she has been taking "as needed" but has been taking it the majority of the days over the past week.  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  She also has a sore "pinch" in her right middle back.  This has been present for about a week.  Between this and lower extremity cramping, she has been having some difficulty sleeping.  She also has a chronic tightness in her left forehead.  She was concerned that maybe she was having a stroke.  However, this is not a new symptom for the patient.  Patient denies signs of stroke including: facial droop, slurred speech, aphasia, weakness/numbness in extremities, imbalance/trouble walking.  No hematuria or irritative UTI symptoms including dysuria, increased frequency or urgency. She does report occasionally seeing red blood in her stool. No known hemorrhoids. Patient denies warning symptoms of back pain including: fecal incontinence, urinary retention or overflow incontinence, night sweats, waking from sleep with back pain, unexplained fevers or weight loss, h/o cancer, IVDU, recent trauma.           Home Medications Prior to Admission medications   Medication Sig Start Date End Date Taking? Authorizing Provider  Arginine 1000 MG TABS Take by mouth.    [provider]  Ascorbic Acid (VITAMIN C) 1000 MG tablet Take 1,000 mg by mouth daily.     [provider]  atorvastatin (LIPITOR) 20 MG tablet Take 1 tablet (20 mg total) by mouth daily. 05/05/21   Saguier, Ramon Dredge, PA-C  CALCIUM-VITAMIN D-VITAMIN K PO Take by mouth.    [provider]  losartan (COZAAR) 25 MG tablet Take 1 tablet (25 mg total) by mouth daily. 04/29/22   Saguier, Ramon Dredge, PA-C  MAGNESIUM PO Take by mouth.    [provider]  NIACIN PO Take by mouth.    [provider]  thiamine 100 MG tablet Take by mouth.    [provider]  TURMERIC PO Take by mouth.    [provider]      Allergies    Penicillins    Review of Systems   Review of Systems  Physical Exam Updated Vital Signs BP (!) 145/96   Temp 98.2 F (36.8 C) (Oral)   Resp 17   Ht 5\' 4"  (1.626 m)   Wt 99.8 kg   LMP 12/16/2013   SpO2 99%   BMI 37.76 kg/m   Physical Exam Vitals and nursing note reviewed.  Constitutional:      Appearance: She is well-developed. She is not diaphoretic.  HENT:     Head: Normocephalic and atraumatic.     Right Ear: Tympanic membrane, ear canal and external ear normal.     Left Ear: Tympanic membrane, ear canal and external ear normal.     Nose: Nose normal.     Mouth/Throat:     Mouth:  Mucous membranes are not dry.     Pharynx: Uvula midline.  Eyes:     General: Lids are normal.     Extraocular Movements:     Right eye: No nystagmus.     Left eye: No nystagmus.     Conjunctiva/sclera: Conjunctivae normal.     Pupils: Pupils are equal, round, and reactive to light.  Neck:     Vascular: Normal carotid pulses. No JVD.     Trachea: Trachea normal. No tracheal deviation.  Cardiovascular:     Rate and Rhythm: Normal rate and regular rhythm.     Pulses: No decreased pulses.          Radial pulses are 2+ on the right side and 2+ on the left side.     Heart sounds: Normal heart sounds, S1 normal and S2 normal. No murmur heard. Pulmonary:     Effort: Pulmonary effort is normal. No respiratory distress.      Breath sounds: Normal breath sounds. No wheezing.  Chest:     Chest wall: No tenderness.  Abdominal:     General: Bowel sounds are normal.     Palpations: Abdomen is soft.     Tenderness: There is no abdominal tenderness. There is no guarding or rebound.  Musculoskeletal:        General: Normal range of motion.     Cervical back: Normal range of motion and neck supple. No tenderness or bony tenderness. No muscular tenderness.     Thoracic back: Tenderness present.     Lumbar back: No tenderness.       Back:  Skin:    General: Skin is warm and dry.     Coloration: Skin is not pale.  Neurological:     Mental Status: She is alert and oriented to person, place, and time.     GCS: GCS eye subscore is 4. GCS verbal subscore is 5. GCS motor subscore is 6.     Cranial Nerves: No cranial nerve deficit.     Motor: No weakness.     ED Results / Procedures / Treatments   Labs (all labs ordered are listed, but only abnormal results are displayed) Labs Reviewed  URINALYSIS, ROUTINE W REFLEX MICROSCOPIC - Abnormal; Notable for the following components:      Result Value   Color, Urine COLORLESS (*)    Ketones, ur 15 (*)    All other components within normal limits  CBC WITH DIFFERENTIAL/PLATELET - Abnormal; Notable for the following components:   Neutro Abs 1.6 (*)    All other components within normal limits  BASIC METABOLIC PANEL - Abnormal; Notable for the following components:   Glucose, Bld 102 (*)    All other components within normal limits    EKG None  Radiology No results found.  Procedures Procedures    Medications Ordered in ED Medications - No data to display  ED Course/ Medical Decision Making/ A&P    Patient seen and examined. History obtained directly from patient.  I also reviewed PCP notes.  Labs/EKG: Ordered CBC, BMP, UA.  Imaging: None ordered  Medications/Fluids: None ordered  Most recent vital signs reviewed and are as follows: BP (!) 145/96    Temp 98.2 F (36.8 C) (Oral)   Resp 17   Ht 5\' 4"  (1.626 m)   Wt 99.8 kg   LMP 12/16/2013   SpO2 99%   BMI 37.76 kg/m   Initial impression: Back pain which seems musculoskeletal in nature as it  is reproducible to palpation.  No red flags.  Elevated blood pressure, mild.  I recommended that patient take the blood pressure medication every day given that her blood pressures have been running in the 140-150 systolic range and follow-up with PCP to fine-tune her blood pressure medication.  Low concern for endorgan damage at this time.  Low concern for hemorrhagic stroke.  Blood in the stool, not the patient's primary complaint, but will check CBC to ensure no worsening anemia.  She may need to have this followed up as outpatient if it continues.  No abdominal tenderness to suggest infection.  Leg cramps, also chronic.  Will check electrolytes.  11:53 AM Reassessment performed. Patient appears well.  She seems reassured by her lab work today.  Labs personally reviewed and interpreted including: CBC unremarkable; BMP unremarkable, UA without signs of infection.  Reviewed pertinent lab work and imaging with patient at bedside. Questions answered.   Most current vital signs reviewed and are as follows: BP 127/73   Pulse 67   Temp 98.2 F (36.8 C) (Oral)   Resp 16   Ht 5\' 4"  (1.626 m)   Wt 99.8 kg   LMP 12/16/2013   SpO2 99%   BMI 37.76 kg/m   Plan: Discharge to home.   Prescriptions written for: None, offered prescription for muscle relaxer, patient states that she already has some.  She would prefer to use heating pad.  Other home care instructions discussed: Rest, heating pad, gentle stretching on back.  Encouraged continued use of blood pressure medication.  ED return instructions discussed: Worsening amount of blood in the stool, lightheadedness or syncope, worsening controlled pain.  Follow-up instructions discussed: Patient encouraged to follow-up with their PCP in 7 days.                                 Medical Decision Making Amount and/or Complexity of Data Reviewed Labs: ordered.   Back pain: Patient with back pain, no radicular features, localized soreness in the right lateral mid back. No neurological deficits. Patient is ambulatory. No warning symptoms of back pain including: fecal incontinence, urinary retention or overflow incontinence, night sweats, waking from sleep with back pain, unexplained fevers or weight loss, h/o cancer, IVDU, recent trauma. No concern for cauda equina, epidural abscess, or other serious cause of back pain. Conservative measures such as rest, ice/heat and pain medicine indicated with PCP follow-up if no improvement with conservative management.  UA was negative.  Patient reports intermittent blood in the stool, normal hemoglobin, no abnormal vital signs or focal abdominal pain.  Feel that she can follow-up with [place] as outpatient.  The patient's vital signs, pertinent lab work and imaging were reviewed and interpreted as discussed in the ED course. Hospitalization was considered for further testing, treatments, or serial exams/observation. However as patient is well-appearing, has a stable exam, and reassuring studies today, I do not feel that they warrant admission at this time. This plan was discussed with the patient who verbalizes agreement and comfort with this plan and seems reliable and able to return to the Emergency Department with worsening or changing symptoms.           Final Clinical Impression(s) / ED Diagnoses Final diagnoses:  Acute right-sided thoracic back pain  Hypertension, unspecified type    Rx / DC Orders ED Discharge Orders     None         Renne Crigler,  PA-C 06/28/22 1156    Melene Plan, DO 06/28/22 1205

## 2022-07-22 ENCOUNTER — Encounter: Payer: BC Managed Care – PPO | Admitting: Medical

## 2022-08-05 ENCOUNTER — Telehealth: Payer: Self-pay | Admitting: Medical

## 2022-08-05 ENCOUNTER — Encounter: Payer: Self-pay | Admitting: Medical

## 2022-08-05 ENCOUNTER — Ambulatory Visit (INDEPENDENT_AMBULATORY_CARE_PROVIDER_SITE_OTHER): Payer: BC Managed Care – PPO | Admitting: Medical

## 2022-08-05 VITALS — BP 128/80 | HR 72 | Resp 18 | Ht 64.0 in | Wt 217.6 lb

## 2022-08-05 DIAGNOSIS — E559 Vitamin D deficiency, unspecified: Secondary | ICD-10-CM

## 2022-08-05 DIAGNOSIS — Z0000 Encounter for general adult medical examination without abnormal findings: Secondary | ICD-10-CM

## 2022-08-05 DIAGNOSIS — R739 Hyperglycemia, unspecified: Secondary | ICD-10-CM | POA: Diagnosis not present

## 2022-08-05 LAB — COMPREHENSIVE METABOLIC PANEL
ALT: 44 U/L — ABNORMAL HIGH (ref 0–35)
AST: 28 U/L (ref 0–37)
Albumin: 4.1 g/dL (ref 3.5–5.2)
Alkaline Phosphatase: 128 U/L — ABNORMAL HIGH (ref 39–117)
BUN: 11 mg/dL (ref 6–23)
CO2: 28 mEq/L (ref 19–32)
Calcium: 9.9 mg/dL (ref 8.4–10.5)
Chloride: 108 mEq/L (ref 96–112)
Creatinine, Ser: 0.87 mg/dL (ref 0.40–1.20)
GFR: 72.3 mL/min (ref >60.00)
Glucose, Bld: 97 mg/dL (ref 70–99)
Potassium: 4.2 mEq/L (ref 3.5–5.1)
Sodium: 141 mEq/L (ref 135–145)
Total Bilirubin: 0.7 mg/dL (ref 0.2–1.2)
Total Protein: 6.3 g/dL (ref 6.0–8.3)

## 2022-08-05 LAB — CBC WITH DIFFERENTIAL/PLATELET
Basophils Absolute: 0 10*3/uL (ref 0.0–0.1)
Basophils Relative: 0.8 % (ref 0.0–3.0)
Eosinophils Absolute: 0.1 10*3/uL (ref 0.0–0.7)
Eosinophils Relative: 2.5 % (ref 0.0–5.0)
HCT: 41.3 % (ref 36.0–46.0)
Hemoglobin: 13.8 g/dL (ref 12.0–15.0)
Lymphocytes Relative: 53.2 % — ABNORMAL HIGH (ref 12.0–46.0)
Lymphs Abs: 2.4 10*3/uL (ref 0.7–4.0)
MCHC: 33.5 g/dL (ref 30.0–36.0)
MCV: 92.7 fl (ref 78.0–100.0)
Monocytes Absolute: 0.3 10*3/uL (ref 0.1–1.0)
Monocytes Relative: 6.3 % (ref 3.0–12.0)
Neutro Abs: 1.6 10*3/uL (ref 1.4–7.7)
Neutrophils Relative %: 37.2 % — ABNORMAL LOW (ref 43.0–77.0)
Platelets: 190 10*3/uL (ref 150.0–400.0)
RBC: 4.45 Mil/uL (ref 3.87–5.11)
RDW: 13.4 % (ref 11.5–15.5)
WBC: 4.4 10*3/uL (ref 4.0–10.5)

## 2022-08-05 LAB — LIPID PANEL
Cholesterol: 216 mg/dL — ABNORMAL HIGH (ref 0–200)
HDL: 82 mg/dL (ref >39.00)
LDL Cholesterol: 121 mg/dL — ABNORMAL HIGH (ref 0–99)
NonHDL: 134.49
Total CHOL/HDL Ratio: 3
Triglycerides: 68 mg/dL (ref 0.0–149.0)
VLDL: 13.6 mg/dL (ref 0.0–40.0)

## 2022-08-05 LAB — HEMOGLOBIN A1C: Hgb A1c MFr Bld: 5.7 % (ref 4.6–6.5)

## 2022-08-05 LAB — VITAMIN D 25 HYDROXY (VIT D DEFICIENCY, FRACTURES): VITD: 35.58 ng/mL (ref 30.00–100.00)

## 2022-08-05 MED ORDER — ATORVASTATIN CALCIUM 20 MG PO TABS: 20.0000 mg | ORAL_TABLET | Freq: Every day | ORAL | 3 refills | Status: DC

## 2022-08-05 NOTE — Telephone Encounter (Signed)
 Refilled atorvastatin today 

## 2022-08-05 NOTE — Progress Notes (Signed)
Subjective:    Patient ID: Lacey Green, female    DOB: 08/31/1960, 62 y.o.   MRN: [id number]  HPI  Pt works at [employer]. She will change schools to [employer], Pt has not been exercising but plans to exercise more. She states her diet has been ok. Less carbs. Nonsmoker. Pt has stopped drinking wine recently. Former 1 glass 3-4 days a week. She plans to cut back on alcohol use.   Pt has mammogram scheduled for Thursday. Also will get pap smear.    Pt has not had shingles vaccine.  She declines vaccine.  Mild sugar elevation in the past.    Review of Systems  Constitutional:  Negative for chills, fatigue and fever.  HENT:         Pt self referred to ent for what she thinks are tonsil stones. She has appt in Sept 6, 2024. Strongly encouraged and explained to keep that. On exam I don't see abnormality.  Respiratory:  Negative for choking, chest tightness and wheezing.   Cardiovascular:  Negative for chest pain and palpitations.  Gastrointestinal:  Negative for abdominal distention and anal bleeding.  Musculoskeletal:  Negative for back pain.  Skin:  Negative for rash.  Neurological:  Negative for dizziness and headaches.  Hematological:  Negative for adenopathy. Does not bruise/bleed easily.  Psychiatric/Behavioral:  Negative for behavioral problems and decreased concentration. The patient is not nervous/anxious.     Past Medical History:  Diagnosis Date   Arthritis    Headache(784.0)    Hypertension      Social History   Socioeconomic History   Marital status: Divorced    Spouse name: Not on file   Number of children: 3   Years of education: Not on file   Highest education level: Not on file  Occupational History    Employer: [employer]  Tobacco Use   Smoking status: Never   Smokeless tobacco: Never  Vaping Use   Vaping status: Never Used  Substance and Sexual Activity   Alcohol use: Yes    Comment: occasional   Drug use: No   Sexual activity: Not  on file  Other Topics Concern   Not on file  Social History Narrative   Exercise: yes; except for the last 2 months.   Caffeine: 2 cups coffee daily.         Social Determinants of Health   Financial Resource Strain: Not on file  Food Insecurity: Not on file  Transportation Needs: Not on file  Physical Activity: Not on file  Stress: Not on file  Social Connections: Unknown (06/04/2021)   [facility] from [facility], [facility]   Social Network    Social Network: Not on file  Intimate Partner Violence: Unknown (04/26/2021)   Received from [facility], [facility]   HITS    Physically Hurt: Not on file    Insult or Talk Down To: Not on file    Threaten Physical Harm: Not on file    Scream or Curse: Not on file    Past Surgical History:  Procedure Laterality Date   BUNIONECTOMY     COLONOSCOPY     02-06-2015   LOWER EXTREMITY ANGIOGRAM     POLYPECTOMY     TUBAL LIGATION     WISDOM TOOTH EXTRACTION      Family History  Problem Relation Age of Onset   Hypertension Mother    Colon polyps Mother        12 inches  of colon removed   Prostate cancer Father    Diabetes Father    Cancer Father        prostate   Ovarian cancer Maternal Aunt    Cancer Maternal Aunt        ovarian   Stomach cancer Paternal Uncle    Cancer Paternal Uncle        stomach   Colon polyps Maternal Grandmother    Hypertension Maternal Grandmother    Arthritis Maternal Grandmother    Diabetes Maternal Grandfather    Cancer Paternal Grandmother        breast; left mastectomy   Heart disease Paternal Grandfather        MI   Colon cancer Neg Hx    Esophageal cancer Neg Hx    Rectal cancer Neg Hx     Allergies  Allergen Reactions   Penicillins     Current Outpatient Medications on File Prior to Visit  Medication Sig Dispense Refill   Arginine 1000 MG TABS Take by mouth.     Ascorbic Acid (VITAMIN C) 1000 MG tablet Take 1,000 mg by mouth daily.     atorvastatin (LIPITOR) 20 MG  tablet Take 1 tablet (20 mg total) by mouth daily. 90 tablet 3   CALCIUM-VITAMIN D-VITAMIN K PO Take by mouth.     losartan (COZAAR) 25 MG tablet Take 1 tablet (25 mg total) by mouth daily. 15 tablet 0   MAGNESIUM PO Take by mouth.     NIACIN PO Take by mouth.     thiamine 100 MG tablet Take by mouth.     TURMERIC PO Take by mouth.     No current facility-administered medications on file prior to visit.    BP 128/80   Pulse 72   Resp 18   Ht 5\' 4"  (1.626 m)   Wt 217 lb 9.6 oz (98.7 kg)   LMP 12/16/2013   SpO2 100%   BMI 37.35 kg/m        Objective:   Physical Exam  General Mental Status- Alert. General Appearance- Not in acute distress.   Skin General: Color- Normal Color. Moisture- Normal Moisture.  Neck Carotid Arteries- Normal color. Moisture- Normal Moisture. No carotid bruits. No JVD.  Chest and Lung Exam Auscultation: Breath Sounds:-Normal.  Cardiovascular Auscultation:Rythm- Regular. Murmurs & Other Heart Sounds:Auscultation of the heart reveals- No Murmurs.  Abdomen Inspection:-Inspeection Normal. Palpation/Percussion:Note:No mass. Palpation and Percussion of the abdomen reveal- Non Tender, Non Distended + BS, no rebound or guarding.    Neurologic Cranial Nerve exam:- CN III-XII intact(No nystagmus), symmetric smile. Strength:- 5/5 equal and symmetric strength both upper and lower extremities.       Assessment & Plan:  For you wellness exam today I have ordered cbc, cmp, lipid panel and A1c.  Shingles vaccine declined today. Explained if change your mind can get done as nurse visit later.  Recommend exercise and healthy diet.  We will let you know lab results as they come in.  Ask that you get your gyn office to please fax over mammogram and pap results.  Follow up date appointment will be determined after lab review.    Esperanza Richters, PA-C

## 2022-08-05 NOTE — Patient Instructions (Addendum)
For you wellness exam today I have ordered cbc, cmp, lipid panel and A1c.  Shingles vaccine declined today. Explained if change your mind can get done as nurse visit later.  Recommend exercise and healthy diet.  We will let you know lab results as they come in.  Ask that you get your gyn office to please fax over mammogram and pap results.  Follow up date appointment will be determined after lab review.    Preventive Care 74-61 Years Old, Female Preventive care refers to lifestyle choices and visits with your health care provider that can promote health and wellness. Preventive care visits are also called wellness exams. What can I expect for my preventive care visit? Counseling Your health care provider may ask you questions about your: Medical history, including: Past medical problems. Family medical history. Pregnancy history. Current health, including: Menstrual cycle. Method of birth control. Emotional well-being. Home life and relationship well-being. Sexual activity and sexual health. Lifestyle, including: Alcohol, nicotine or tobacco, and drug use. Access to firearms. Diet, exercise, and sleep habits. Work and work Astronomer. Sunscreen use. Safety issues such as seatbelt and bike helmet use. Physical exam Your health care provider will check your: Height and weight. These may be used to calculate your BMI (body mass index). BMI is a measurement that tells if you are at a healthy weight. Waist circumference. This measures the distance around your waistline. This measurement also tells if you are at a healthy weight and may help predict your risk of certain diseases, such as type 2 diabetes and high blood pressure. Heart rate and blood pressure. Body temperature. Skin for abnormal spots. What immunizations do I need?  Vaccines are usually given at various ages, according to a schedule. Your health care provider will recommend vaccines for you based on your age,  medical history, and lifestyle or other factors, such as travel or where you work. What tests do I need? Screening Your health care provider may recommend screening tests for certain conditions. This may include: Lipid and cholesterol levels. Diabetes screening. This is done by checking your blood sugar (glucose) after you have not eaten for a while (fasting). Pelvic exam and Pap test. Hepatitis B test. Hepatitis C test. HIV (human immunodeficiency virus) test. STI (sexually transmitted infection) testing, if you are at risk. Lung cancer screening. Colorectal cancer screening. Mammogram. Talk with your health care provider about when you should start having regular mammograms. This may depend on whether you have a family history of breast cancer. BRCA-related cancer screening. This may be done if you have a family history of breast, ovarian, tubal, or peritoneal cancers. Bone density scan. This is done to screen for osteoporosis. Talk with your health care provider about your test results, treatment options, and if necessary, the need for more tests. Follow these instructions at home: Eating and drinking  Eat a diet that includes fresh fruits and vegetables, whole grains, lean protein, and low-fat dairy products. Take vitamin and mineral supplements as recommended by your health care provider. Do not drink alcohol if: Your health care provider tells you not to drink. You are pregnant, may be pregnant, or are planning to become pregnant. If you drink alcohol: Limit how much you have to 0-1 drink a day. Know how much alcohol is in your drink. In the [place] one drink equals one 12 oz bottle of beer (355 mL), one 5 oz glass of wine (148 mL), or one 1 oz glass of hard liquor (44 mL). Lifestyle Brush  your teeth every morning and night with fluoride toothpaste. Floss one time each day. Exercise for at least 30 minutes 5 or more days each week. Do not use any products that contain nicotine or  tobacco. These products include cigarettes, chewing tobacco, and vaping devices, such as e-cigarettes. If you need help quitting, ask your health care provider. Do not use drugs. If you are sexually active, practice safe sex. Use a condom or other form of protection to prevent STIs. If you do not wish to become pregnant, use a form of birth control. If you plan to become pregnant, see your health care provider for a prepregnancy visit. Take aspirin only as told by your health care provider. Make sure that you understand how much to take and what form to take. Work with your health care provider to find out whether it is safe and beneficial for you to take aspirin daily. Find healthy ways to manage stress, such as: Meditation, yoga, or listening to music. Journaling. Talking to a trusted person. Spending time with friends and family. Minimize exposure to UV radiation to reduce your risk of skin cancer. Safety Always wear your seat belt while driving or riding in a vehicle. Do not drive: If you have been drinking alcohol. Do not ride with someone who has been drinking. When you are tired or distracted. While texting. If you have been using any mind-altering substances or drugs. Wear a helmet and other protective equipment during sports activities. If you have firearms in your house, make sure you follow all gun safety procedures. Seek help if you have been physically or sexually abused. What's next? Visit your health care provider once a year for an annual wellness visit. Ask your health care provider how often you should have your eyes and teeth checked. Stay up to date on all vaccines. This information is not intended to replace advice given to you by your health care provider. Make sure you discuss any questions you have with your health care provider. Document Revised: 07/05/2020 Document Reviewed: 07/05/2020 Elsevier Patient Education  2024 [employer].

## 2023-01-23 ENCOUNTER — Ambulatory Visit: Payer: Self-pay | Admitting: Medical

## 2023-01-23 NOTE — Telephone Encounter (Signed)
   Chief Complaint: headache Symptoms: headache on right temple, intermittent dizziness, soreness of back of neck Frequency: comes and goes x 2 months Pertinent Negatives: Patient denies fever, stiff neck, sob Disposition: [] ED /[x] Urgent Care (no appt availability in office) / [] Appointment(In office/virtual)/ []  [facility]/ [] Home Care/ [] Refused Recommended Disposition /[]  Mobile Bus/ []  Follow-up with PCP Additional Notes: Patient reports that she has been experiencing intermittent headaches that rotates location x 2 months. As of 2 days ago the headache has been present as a sharp pain on her right temple. Patient also reports intermittent dizziness, and soreness at the back of her neck. Patient reports she has been using tiger balm with no relief. Per protocol, this RN attempted to schedule in office visit, no availability. This RN was able to schedule UC appt tomorrow 1/3. Patient advised to go to UC as a walk in sooner or call back if symptoms worsen. Patient verbalized understanding.     Copied from CRM [id number]. Topic: Clinical - Pink Word Triage >> Jan 23, 2023 11:48 AM Melissa C wrote: Reason for Triage: patient's head is increasingly hurting on a different side than usual and feels light. Also feels a little nauseous. Can't get in with provider until Monday and she said she would have to go to the ER before then. Would like a nurse call. Reason for Disposition  [1] MODERATE headache (e.g., interferes with normal activities) AND [2] present > 24 hours AND [3] unexplained  (Exceptions: analgesics not tried, typical migraine, or headache part of viral illness)  Answer Assessment - Initial Assessment Questions 1. LOCATION: Where does it hurt?      2 months ago was temple on left, 2 days ago temples on right  2. ONSET: When did the headache start? (Minutes, hours or days)      2 months ago, but is intermittent 3. PATTERN: Does the pain come and go, or has it  been constant since it started?     Comes and goes sharp pain 4. SEVERITY: How bad is the pain? and What does it keep you from doing?  (e.g., Scale 1-10; mild, moderate, or severe)   - MILD (1-3): doesn't interfere with normal activities    - MODERATE (4-7): interferes with normal activities or awakens from sleep    - SEVERE (8-10): excruciating pain, unable to do any normal activities        moderate 5. RECURRENT SYMPTOM: Have you ever had headaches before? If Yes, ask: When was the last time? and What happened that time?      yes 6. CAUSE: What do you think is causing the headache?     I'm not sure this has been ongoing 7. MIGRAINE: Have you been diagnosed with migraine headaches? If Yes, ask: Is this headache similar?      no 8. HEAD INJURY: Has there been any recent injury to the head?      none 9. OTHER SYMPTOMS: Do you have any other symptoms? (fever, stiff neck, eye pain, sore throat, cold symptoms)     Sore back of neck, intermittent dizziness  Protocols used: Headache-A-AH

## 2023-01-24 ENCOUNTER — Ambulatory Visit: Payer: Self-pay

## 2023-08-06 ENCOUNTER — Ambulatory Visit (INDEPENDENT_AMBULATORY_CARE_PROVIDER_SITE_OTHER): Admitting: Medical

## 2023-08-06 VITALS — BP 138/82 | HR 67 | Temp 97.8°F | Resp 16 | Ht 64.0 in | Wt 235.4 lb

## 2023-08-06 DIAGNOSIS — R739 Hyperglycemia, unspecified: Secondary | ICD-10-CM | POA: Diagnosis not present

## 2023-08-06 DIAGNOSIS — R5383 Other fatigue: Secondary | ICD-10-CM | POA: Diagnosis not present

## 2023-08-06 DIAGNOSIS — I10 Essential (primary) hypertension: Secondary | ICD-10-CM

## 2023-08-06 DIAGNOSIS — R748 Abnormal levels of other serum enzymes: Secondary | ICD-10-CM

## 2023-08-06 DIAGNOSIS — Z13220 Encounter for screening for lipoid disorders: Secondary | ICD-10-CM | POA: Diagnosis not present

## 2023-08-06 DIAGNOSIS — E559 Vitamin D deficiency, unspecified: Secondary | ICD-10-CM | POA: Diagnosis not present

## 2023-08-06 DIAGNOSIS — Z0000 Encounter for general adult medical examination without abnormal findings: Secondary | ICD-10-CM | POA: Diagnosis not present

## 2023-08-06 DIAGNOSIS — Z23 Encounter for immunization: Secondary | ICD-10-CM

## 2023-08-06 LAB — COMPREHENSIVE METABOLIC PANEL WITH GFR
ALT: 45 U/L — ABNORMAL HIGH (ref 0–35)
AST: 27 U/L (ref 0–37)
Albumin: 4.3 g/dL (ref 3.5–5.2)
Alkaline Phosphatase: 133 U/L — ABNORMAL HIGH (ref 39–117)
BUN: 11 mg/dL (ref 6–23)
CO2: 29 meq/L (ref 19–32)
Calcium: 9.6 mg/dL (ref 8.4–10.5)
Chloride: 105 meq/L (ref 96–112)
Creatinine, Ser: 0.8 mg/dL (ref 0.40–1.20)
GFR: 79.39 mL/min (ref >60.00)
Glucose, Bld: 86 mg/dL (ref 70–99)
Potassium: 3.8 meq/L (ref 3.5–5.1)
Sodium: 141 meq/L (ref 135–145)
Total Bilirubin: 0.6 mg/dL (ref 0.2–1.2)
Total Protein: 6.5 g/dL (ref 6.0–8.3)

## 2023-08-06 LAB — CBC WITH DIFFERENTIAL/PLATELET
Basophils Absolute: 0 K/uL (ref 0.0–0.1)
Basophils Relative: 0.9 % (ref 0.0–3.0)
Eosinophils Absolute: 0.1 K/uL (ref 0.0–0.7)
Eosinophils Relative: 2 % (ref 0.0–5.0)
HCT: 41.9 % (ref 36.0–46.0)
Hemoglobin: 14 g/dL (ref 12.0–15.0)
Lymphocytes Relative: 46.9 % — ABNORMAL HIGH (ref 12.0–46.0)
Lymphs Abs: 2.4 K/uL (ref 0.7–4.0)
MCHC: 33.4 g/dL (ref 30.0–36.0)
MCV: 92.2 fl (ref 78.0–100.0)
Monocytes Absolute: 0.3 K/uL (ref 0.1–1.0)
Monocytes Relative: 5.6 % (ref 3.0–12.0)
Neutro Abs: 2.3 K/uL (ref 1.4–7.7)
Neutrophils Relative %: 44.6 % (ref 43.0–77.0)
Platelets: 195 K/uL (ref 150.0–400.0)
RBC: 4.55 Mil/uL (ref 3.87–5.11)
RDW: 13 % (ref 11.5–15.5)
WBC: 5.1 K/uL (ref 4.0–10.5)

## 2023-08-06 LAB — LIPID PANEL
Cholesterol: 220 mg/dL — ABNORMAL HIGH (ref 0–200)
HDL: 95.6 mg/dL (ref >39.00)
LDL Cholesterol: 110 mg/dL — ABNORMAL HIGH (ref 0–99)
NonHDL: 124.71
Total CHOL/HDL Ratio: 2
Triglycerides: 73 mg/dL (ref 0.0–149.0)
VLDL: 14.6 mg/dL (ref 0.0–40.0)

## 2023-08-06 LAB — HEMOGLOBIN A1C: Hgb A1c MFr Bld: 5.9 % (ref 4.6–6.5)

## 2023-08-06 LAB — TSH: TSH: 0.97 u[IU]/mL (ref 0.35–5.50)

## 2023-08-06 LAB — VITAMIN D 25 HYDROXY (VIT D DEFICIENCY, FRACTURES): VITD: 48.51 ng/mL (ref 30.00–100.00)

## 2023-08-06 LAB — VITAMIN B12: Vitamin B-12: 673 pg/mL (ref 211–911)

## 2023-08-06 LAB — T4, FREE: Free T4: 0.79 ng/dL (ref 0.60–1.60)

## 2023-08-06 MED ORDER — FLUTICASONE PROPIONATE 50 MCG/ACT NA SUSP: 2.0000 | Freq: Every day | NASAL | 1 refills | Status: AC

## 2023-08-06 NOTE — Patient Instructions (Addendum)
 For you wellness exam today I have ordered cbc, cmp and  lipid panel.  Vaccine given today shingrix   Up to date on mammogram and pap. Please get us  copy of those records.  Recommend exercise and healthy diet.  We will let you know lab results as they come in.  Follow up date appointment will be determined after lab review.      Essential hypertension -bp is high today. Recommend check bp daily and if not close to 130/80 go ahead losartan  daily.  Elevated blood sugar -low sugar diet - Hemoglobin A1c  Vitamin D  deficiency - VITAMIN D  25 Hydroxy (Vit-D Deficiency, Fractures)  Fatigue, unspecified type Follow wellness exam labs and below. - TSH - T4, free - B12   Recommend healthier diet and exercise for weight loss.  Preventive Care 12-16 Years Old, Female Preventive care refers to lifestyle choices and visits with your health care provider that can promote health and wellness. Preventive care visits are also called wellness exams. What can I expect for my preventive care visit? Counseling Your health care provider may ask you questions about your: Medical history, including: Past medical problems. Family medical history. Pregnancy history. Current health, including: Menstrual cycle. Method of birth control. Emotional well-being. Home life and relationship well-being. Sexual activity and sexual health. Lifestyle, including: Alcohol, nicotine or tobacco, and drug use. Access to firearms. Diet, exercise, and sleep habits. Work and work Astronomer. Sunscreen use. Safety issues such as seatbelt and bike helmet use. Physical exam Your health care provider will check your: Height and weight. These may be used to calculate your BMI (body mass index). BMI is a measurement that tells if you are at a healthy weight. Waist circumference. This measures the distance around your waistline. This measurement also tells if you are at a healthy weight and may help predict your  risk of certain diseases, such as type 2 diabetes and high blood pressure. Heart rate and blood pressure. Body temperature. Skin for abnormal spots. What immunizations do I need?  Vaccines are usually given at various ages, according to a schedule. Your health care provider will recommend vaccines for you based on your age, medical history, and lifestyle or other factors, such as travel or where you work. What tests do I need? Screening Your health care provider may recommend screening tests for certain conditions. This may include: Lipid and cholesterol levels. Diabetes screening. This is done by checking your blood sugar (glucose) after you have not eaten for a while (fasting). Pelvic exam and Pap test. Hepatitis B test. Hepatitis C test. HIV (human immunodeficiency virus) test. STI (sexually transmitted infection) testing, if you are at risk. Lung cancer screening. Colorectal cancer screening. Mammogram. Talk with your health care provider about when you should start having regular mammograms. This may depend on whether you have a family history of breast cancer. BRCA-related cancer screening. This may be done if you have a family history of breast, ovarian, tubal, or peritoneal cancers. Bone density scan. This is done to screen for osteoporosis. Talk with your health care provider about your test results, treatment options, and if necessary, the need for more tests. Follow these instructions at home: Eating and drinking  Eat a diet that includes fresh fruits and vegetables, whole grains, lean protein, and low-fat dairy products. Take vitamin and mineral supplements as recommended by your health care provider. Do not drink alcohol if: Your health care provider tells you not to drink. You are pregnant, may be pregnant, or are planning  to become pregnant. If you drink alcohol: Limit how much you have to 0-1 drink a day. Know how much alcohol is in your drink. In the [place] one drink  equals one 12 oz bottle of beer (355 mL), one 5 oz glass of wine (148 mL), or one 1 oz glass of hard liquor (44 mL). Lifestyle Brush your teeth every morning and night with fluoride toothpaste. Floss one time each day. Exercise for at least 30 minutes 5 or more days each week. Do not use any products that contain nicotine or tobacco. These products include cigarettes, chewing tobacco, and vaping devices, such as e-cigarettes. If you need help quitting, ask your health care provider. Do not use drugs. If you are sexually active, practice safe sex. Use a condom or other form of protection to prevent STIs. If you do not wish to become pregnant, use a form of birth control. If you plan to become pregnant, see your health care provider for a prepregnancy visit. Take aspirin only as told by your health care provider. Make sure that you understand how much to take and what form to take. Work with your health care provider to find out whether it is safe and beneficial for you to take aspirin daily. Find healthy ways to manage stress, such as: Meditation, yoga, or listening to music. Journaling. Talking to a trusted person. Spending time with friends and family. Minimize exposure to UV radiation to reduce your risk of skin cancer. Safety Always wear your seat belt while driving or riding in a vehicle. Do not drive: If you have been drinking alcohol. Do not ride with someone who has been drinking. When you are tired or distracted. While texting. If you have been using any mind-altering substances or drugs. Wear a helmet and other protective equipment during sports activities. If you have firearms in your house, make sure you follow all gun safety procedures. Seek help if you have been physically or sexually abused. What's next? Visit your health care provider once a year for an annual wellness visit. Ask your health care provider how often you should have your eyes and teeth checked. Stay up to  date on all vaccines. This information is not intended to replace advice given to you by your health care provider. Make sure you discuss any questions you have with your health care provider. Document Revised: 07/05/2020 Document Reviewed: 07/05/2020 Elsevier Patient Education  2024 [employer].

## 2023-08-06 NOTE — Progress Notes (Signed)
 Subjective:    Patient ID: Lacey RIECKE, female    DOB: 1960-07-11, 63 y.o.   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 , Pt has not been exercising but plans to exercise more. She states her diet has been ok. Less carbs. Nonsmoker. Pt has stopped drinking wine recently. Former 1 glass 3-4 days a week. She plans to cut back on alcohol use. 2 cups of coffee a day.   Pt due for shingrix . Benefit vs risk dicussed. Pt decided to get.  Pt had mammogram thru gyn office. Pt states up to date on pap but not due for repeat.   Htn- she only take losartan  sporadically. She states she check bp once a week. Her bp varies. She can't remember readings. Occasional 140 systolic. She indicates prefers natural measures such a beet juice. BP at gyn last week 138/80.   Last year sugar was 5.7/prediabetic.  Obesity- pt admits can do better with both diet and exercise.    Review of Systems  Constitutional:  Positive for fatigue. Negative for chills and fever.       Mild fatigue. Maybe work related.  HENT:  Positive for congestion. Negative for drooling and ear pain.        Some recent nasal congestion after cleaning vents of parents home.   Respiratory:  Negative for cough, chest tightness, shortness of breath and wheezing.   Cardiovascular:  Negative for chest pain and palpitations.  Gastrointestinal:  Negative for abdominal pain, blood in stool, diarrhea, nausea and vomiting.  Genitourinary:  Negative for dysuria, flank pain and frequency.  Musculoskeletal:  Negative for back pain, myalgias and neck stiffness.  Skin:  Negative for rash.  Neurological:  Negative for dizziness, speech difficulty, weakness and light-headedness.  Hematological:  Negative for adenopathy. Does not bruise/bleed easily.  Psychiatric/Behavioral:  Negative for behavioral problems and decreased concentration. The patient is not nervous/anxious and is not hyperactive.     Past Medical History:   Diagnosis Date   Arthritis    Headache(784.0)    Hypertension      Social History   Socioeconomic History   Marital status: Divorced    Spouse name: Not on file   Number of children: 3   Years of education: Not on file   Highest education level: Master's degree (e.g., MA, MS, MEng, MEd, MSW, MBA)  Occupational History    Employer: [employer]  Tobacco Use   Smoking status: Never   Smokeless tobacco: Never  Vaping Use   Vaping status: Never Used  Substance and Sexual Activity   Alcohol use: Yes    Comment: occasional   Drug use: No   Sexual activity: Not on file  Other Topics Concern   Not on file  Social History Narrative   Exercise: yes; except for the last 2 months.   Caffeine: 2 cups coffee daily.         Social Drivers of Corporate investment banker Strain: Low Risk  (08/05/2023)   Overall Financial Resource Strain (CARDIA)    Difficulty of Paying Living Expenses: Not hard at all  Food Insecurity: No Food Insecurity (08/05/2023)   Hunger Vital Sign    Worried About Running Out of Food in the Last Year: Never true    Ran Out of Food in the Last Year: Never true  Transportation Needs: No Transportation Needs (08/05/2023)   PRAPARE - Administrator, Civil Service (Medical): No    Lack  of Transportation (Non-Medical): No  Physical Activity: Inactive (08/05/2023)   Exercise Vital Sign    Days of Exercise per Week: 0 days    Minutes of Exercise per Session: Not on file  Stress: No Stress Concern Present (08/05/2023)   [employer] of Occupational Health - Occupational Stress Questionnaire    Feeling of Stress: Not at all  Social Connections: Moderately Isolated (08/05/2023)   Social Connection and Isolation Panel    Frequency of Communication with Friends and Family: More than three times a week    Frequency of Social Gatherings with Friends and Family: Twice a week    Attends Religious Services: More than 4 times per year    Active Member of [employer] or  Organizations: No    Attends Engineer, structural: Not on file    Marital Status: Divorced  Intimate Partner Violence: Unknown (04/26/2021)   Received from [facility]   HITS    Physically Hurt: Not on file    Insult or Talk Down To: Not on file    Threaten Physical Harm: Not on file    Scream or Curse: Not on file    Past Surgical History:  Procedure Laterality Date   BUNIONECTOMY     COLONOSCOPY     02-06-2015   LOWER EXTREMITY ANGIOGRAM     POLYPECTOMY     TUBAL LIGATION     WISDOM TOOTH EXTRACTION      Family History  Problem Relation Age of Onset   Hypertension Mother    Colon polyps Mother        12 inches of colon removed   Prostate cancer Father    Diabetes Father    Cancer Father        prostate   Ovarian cancer Maternal Aunt    Cancer Maternal Aunt        ovarian   Stomach cancer Paternal Uncle    Cancer Paternal Uncle        stomach   Colon polyps Maternal Grandmother    Hypertension Maternal Grandmother    Arthritis Maternal Grandmother    Diabetes Maternal Grandfather    Cancer Paternal Grandmother        breast; left mastectomy   Heart disease Paternal Grandfather        MI   Colon cancer Neg Hx    Esophageal cancer Neg Hx    Rectal cancer Neg Hx     Allergies  Allergen Reactions   Penicillins     Current Outpatient Medications on File Prior to Visit  Medication Sig Dispense Refill   Arginine 1000 MG TABS Take by mouth.     Ascorbic Acid (VITAMIN C) 1000 MG tablet Take 1,000 mg by mouth daily.     CALCIUM -VITAMIN D -VITAMIN K PO Take by mouth.     losartan  (COZAAR ) 25 MG tablet Take 1 tablet (25 mg total) by mouth daily. 15 tablet 0   MAGNESIUM PO Take by mouth.     NIACIN PO Take by mouth.     thiamine 100 MG tablet Take by mouth.     TURMERIC PO Take by mouth.     Vitamin D -Vitamin K (D3 + K2 PO) Take by mouth 1 day or 1 dose.     No current facility-administered medications on file prior to visit.    BP 138/82 Comment:  155/90  Pulse 67   Temp 97.8 F (36.6 C) (Oral)   Resp 16   Ht 5' 4 (1.626 m)  Wt 235 lb 6.4 oz (106.8 kg)   LMP 12/16/2013   SpO2 98%   BMI 40.41 kg/m          Objective:   Physical Exam  General Mental Status- Alert. General Appearance- Not in acute distress.   Skin General: Color- Normal Color. Moisture- Normal Moisture.  Neck Carotid Arteries- Normal color. Moisture- Normal Moisture. No carotid bruits. No JVD.  Chest and Lung Exam Auscultation: Breath Sounds:-Normal.  Cardiovascular Auscultation:Rythm- Regular. Murmurs & Other Heart Sounds:Auscultation of the heart reveals- No Murmurs.  Abdomen Inspection:-Inspeection Normal. Palpation/Percussion:Note:No mass. Palpation and Percussion of the abdomen reveal- Non Tender, Non Distended + BS, no rebound or guarding.   Neurologic Cranial Nerve exam:- CN III-XII intact(No nystagmus), symmetric smile. Strength:- 5/5 equal and symmetric strength both upper and lower extremities.   Lower ext- calfs symmetric, negative homans signs. No pretibial edema.    Assessment & Plan:   For you wellness exam today I have ordered cbc, cmp and  lipid panel.  Vaccine given today shingrix   Up to date on mammogram and pap. Please get us  copy of those records.  Recommend exercise and healthy diet.  We will let you know lab results as they come in.  Follow up date appointment will be determined after lab review.      Essential hypertension -bp is high today. Recommend check bp daily and if not close to 130/80 go ahead losartan  daily.  Elevated blood sugar -low sugar diet - Hemoglobin A1c  Vitamin D  deficiency - VITAMIN D  25 Hydroxy (Vit-D Deficiency, Fractures)  Fatigue, unspecified type Follow wellness exam labs and below. - TSH - T4, free - B12   Recommend healthier diet and exercise for weight loss.  Dallas Maxwell, PA-C   [id number] as did address elevated sugar, vit d def, fatigue and htn.  Lazlo Tunney, PA-C

## 2023-08-07 ENCOUNTER — Ambulatory Visit: Payer: Self-pay | Admitting: Medical

## 2023-08-07 MED ORDER — ATORVASTATIN CALCIUM 10 MG PO TABS: 10.0000 mg | ORAL_TABLET | Freq: Every day | ORAL | 3 refills | Status: AC

## 2023-08-07 NOTE — Addendum Note (Signed)
 Addended by: DORINA DALLAS HERO on: 08/07/2023 11:41 PM   Modules accepted: Orders

## 2023-08-08 NOTE — Addendum Note (Signed)
 Addended by: DORLENE CHIQUITA RAMAN on: 08/08/2023 11:30 AM   Modules accepted: Orders

## 2023-11-25 ENCOUNTER — Ambulatory Visit

## 2023-11-25 DIAGNOSIS — Z23 Encounter for immunization: Secondary | ICD-10-CM | POA: Diagnosis not present

## 2023-11-25 NOTE — Progress Notes (Signed)
Pt here today for Shingrix #2 per Ramon Dredge.   Shingrix 0.2mL injected into L deltoid IM. Pt tolerated injection well.

## 2024-01-08 ENCOUNTER — Ambulatory Visit: Admitting: Family Medicine

## 2024-01-08 ENCOUNTER — Encounter: Payer: Self-pay | Admitting: Family Medicine

## 2024-01-08 VITALS — BP 122/80 | HR 82 | Temp 98.7°F | Resp 18 | Ht 64.0 in | Wt 230.6 lb

## 2024-01-08 DIAGNOSIS — J0140 Acute pansinusitis, unspecified: Secondary | ICD-10-CM

## 2024-01-08 MED ORDER — AZITHROMYCIN 250 MG PO TABS: ORAL_TABLET | ORAL | 0 refills | Status: AC

## 2024-01-08 NOTE — Progress Notes (Unsigned)
 Subjective:    Patient ID: Lacey Green, female    DOB: Nov 18, 1960, 63 y.o.   MRN: [id number]  Chief Complaint  Patient presents with   Ear Pain    X2 weeks, pt states having right ear and right neck pain. Pt states having pain and burning. Pt states she had flu like sxs last weekend.     HPI Patient is in today for ear pain and congestion.  Discussed the use of AI scribe software for clinical note transcription with the patient, who gave verbal consent to proceed.  History of Present Illness     Past Medical History:  Diagnosis Date   Arthritis    Headache(784.0)    Hypertension     Past Surgical History:  Procedure Laterality Date   BUNIONECTOMY     COLONOSCOPY     02-06-2015   LOWER EXTREMITY ANGIOGRAM     POLYPECTOMY     TUBAL LIGATION     WISDOM TOOTH EXTRACTION      Family History  Problem Relation Age of Onset   Hypertension Mother    Colon polyps Mother        12 inches of colon removed   Prostate cancer Father    Diabetes Father    Cancer Father        prostate   Ovarian cancer Maternal Aunt    Cancer Maternal Aunt        ovarian   Stomach cancer Paternal Uncle    Cancer Paternal Uncle        stomach   Colon polyps Maternal Grandmother    Hypertension Maternal Grandmother    Arthritis Maternal Grandmother    Diabetes Maternal Grandfather    Cancer Paternal Grandmother        breast; left mastectomy   Heart disease Paternal Grandfather        MI   Colon cancer Neg Hx    Esophageal cancer Neg Hx    Rectal cancer Neg Hx     Social History   Socioeconomic History   Marital status: Divorced    Spouse name: Not on file   Number of children: 3   Years of education: Not on file   Highest education level: Master's degree (e.g., MA, MS, MEng, MEd, MSW, MBA)  Occupational History    Employer: [employer]  Tobacco Use   Smoking status: Never   Smokeless tobacco: Never  Vaping Use   Vaping status: Never Used  Substance and Sexual Activity    Alcohol use: Yes    Comment: occasional   Drug use: No   Sexual activity: Not on file  Other Topics Concern   Not on file  Social History Narrative   Exercise: yes; except for the last 2 months.   Caffeine: 2 cups coffee daily.         Social Drivers of Health   Tobacco Use: Low Risk (01/08/2024)   Patient History    Smoking Tobacco Use: Never    Smokeless Tobacco Use: Never    Passive Exposure: Not on file  Financial Resource Strain: Low Risk (01/08/2024)   Overall Financial Resource Strain (CARDIA)    Difficulty of Paying Living Expenses: Not hard at all  Food Insecurity: No Food Insecurity (01/08/2024)   Epic    Worried About Radiation Protection Practitioner of Food in the Last Year: Never true    Ran Out of Food in the Last Year: Never true  Transportation Needs: Patient Declined (01/08/2024)  Epic    Lack of Transportation (Medical): Patient declined    Lack of Transportation (Non-Medical): Patient declined  Physical Activity: Insufficiently Active (01/08/2024)   Exercise Vital Sign    Days of Exercise per Week: 3 days    Minutes of Exercise per Session: 10 min  Stress: No Stress Concern Present (01/08/2024)   [employer] of Occupational Health - Occupational Stress Questionnaire    Feeling of Stress: Not at all  Social Connections: Moderately Integrated (01/08/2024)   Social Connection and Isolation Panel    Frequency of Communication with Friends and Family: More than three times a week    Frequency of Social Gatherings with Friends and Family: Once a week    Attends Religious Services: More than 4 times per year    Active Member of [employer] or Organizations: Yes    Attends Banker Meetings: More than 4 times per year    Marital Status: Divorced  Intimate Partner Violence: Unknown (04/26/2021)   Received from [facility]   HITS    Physically Hurt: Not on file    Insult or Talk Down To: Not on file    Threaten Physical Harm: Not on file    Scream or Curse: Not on  file  Depression (PHQ2-9): Low Risk (08/06/2023)   Depression (PHQ2-9)    PHQ-2 Score: 0  Alcohol Screen: Low Risk (08/05/2023)   Alcohol Screen    Last Alcohol Screening Score (AUDIT): 1  Housing: Patient Declined (01/08/2024)   Epic    Unable to Pay for Housing in the Last Year: Patient declined    Number of Times Moved in the Last Year: Not on file    Homeless in the Last Year: Patient declined  Utilities: Not on file  Health Literacy: Not on file    Outpatient Medications Prior to Visit  Medication Sig Dispense Refill   Arginine 1000 MG TABS Take by mouth.     Ascorbic Acid (VITAMIN C) 1000 MG tablet Take 1,000 mg by mouth daily.     atorvastatin  (LIPITOR) 10 MG tablet Take 1 tablet (10 mg total) by mouth daily. 90 tablet 3   CALCIUM -VITAMIN D -VITAMIN K PO Take by mouth.     fluticasone  (FLONASE ) 50 MCG/ACT nasal spray Place 2 sprays into both nostrils daily. 16 g 1   losartan  (COZAAR ) 25 MG tablet Take 1 tablet (25 mg total) by mouth daily. 15 tablet 0   MAGNESIUM PO Take by mouth.     NIACIN PO Take by mouth.     thiamine 100 MG tablet Take by mouth.     TURMERIC PO Take by mouth.     Vitamin D -Vitamin K (D3 + K2 PO) Take by mouth 1 day or 1 dose.     No facility-administered medications prior to visit.    Allergies[1]  Review of Systems  Constitutional:  Negative for fever and malaise/fatigue.  HENT:  Positive for congestion and ear pain.   Eyes:  Negative for blurred vision.  Respiratory:  Negative for cough and shortness of breath.   Cardiovascular:  Negative for chest pain, palpitations and leg swelling.  Gastrointestinal:  Negative for vomiting.  Musculoskeletal:  Negative for back pain.  Skin:  Negative for rash.  Neurological:  Negative for loss of consciousness and headaches.       Objective:    Physical Exam Vitals and nursing note reviewed.  Constitutional:      General: She is not in acute distress.    Appearance: Normal  appearance. She is  well-developed.  HENT:     Head: Normocephalic and atraumatic.     Right Ear: Tenderness present. A middle ear effusion is present. There is impacted cerumen.     Left Ear: Tympanic membrane, ear canal and external ear normal.     Nose: Congestion present.  Eyes:     General: No scleral icterus.       Right eye: No discharge.        Left eye: No discharge.  Cardiovascular:     Rate and Rhythm: Normal rate and regular rhythm.     Heart sounds: No murmur heard. Pulmonary:     Effort: Pulmonary effort is normal. No respiratory distress.     Breath sounds: Normal breath sounds.  Musculoskeletal:        General: Normal range of motion.     Cervical back: Normal range of motion and neck supple.     Right lower leg: No edema.     Left lower leg: No edema.  Skin:    General: Skin is warm and dry.  Neurological:     Mental Status: She is alert and oriented to person, place, and time.  Psychiatric:        Mood and Affect: Mood normal.        Behavior: Behavior normal.        Thought Content: Thought content normal.        Judgment: Judgment normal.     BP 122/80 (BP Location: Left Arm, Patient Position: Sitting, Cuff Size: Large)   Pulse 82   Temp 98.7 F (37.1 C) (Oral)   Resp 18   Ht 5' 4 (1.626 m)   Wt 230 lb 9.6 oz (104.6 kg)   LMP 12/16/2013   SpO2 98%   BMI 39.58 kg/m  Wt Readings from Last 3 Encounters:  01/08/24 230 lb 9.6 oz (104.6 kg)  08/06/23 235 lb 6.4 oz (106.8 kg)  08/05/22 217 lb 9.6 oz (98.7 kg)    Diabetic Foot Exam - Simple   No data filed    Lab Results  Component Value Date   WBC 5.1 08/06/2023   HGB 14.0 08/06/2023   HCT 41.9 08/06/2023   PLT 195.0 08/06/2023   GLUCOSE 86 08/06/2023   CHOL 220 (H) 08/06/2023   TRIG 73.0 08/06/2023   HDL 95.60 08/06/2023   LDLCALC 110 (H) 08/06/2023   ALT 45 (H) 08/06/2023   AST 27 08/06/2023   NA 141 08/06/2023   K 3.8 08/06/2023   CL 105 08/06/2023   CREATININE 0.80 08/06/2023   BUN 11 08/06/2023    CO2 29 08/06/2023   TSH 0.97 08/06/2023   HGBA1C 5.9 08/06/2023    Lab Results  Component Value Date   TSH 0.97 08/06/2023   Lab Results  Component Value Date   WBC 5.1 08/06/2023   HGB 14.0 08/06/2023   HCT 41.9 08/06/2023   MCV 92.2 08/06/2023   PLT 195.0 08/06/2023   Lab Results  Component Value Date   NA 141 08/06/2023   K 3.8 08/06/2023   CO2 29 08/06/2023   GLUCOSE 86 08/06/2023   BUN 11 08/06/2023   CREATININE 0.80 08/06/2023   BILITOT 0.6 08/06/2023   ALKPHOS 133 (H) 08/06/2023   AST 27 08/06/2023   ALT 45 (H) 08/06/2023   PROT 6.5 08/06/2023   ALBUMIN 4.3 08/06/2023   CALCIUM  9.6 08/06/2023   ANIONGAP 6 06/28/2022   GFR 79.39 08/06/2023   Lab Results  Component Value Date   CHOL 220 (H) 08/06/2023   Lab Results  Component Value Date   HDL 95.60 08/06/2023   Lab Results  Component Value Date   LDLCALC 110 (H) 08/06/2023   Lab Results  Component Value Date   TRIG 73.0 08/06/2023   Lab Results  Component Value Date   CHOLHDL 2 08/06/2023   Lab Results  Component Value Date   HGBA1C 5.9 08/06/2023       Assessment & Plan:  Acute non-recurrent pansinusitis -     Azithromycin ; As directed  Dispense: 6 each; Refill: 0  Assessment and Plan Assessment & Plan     Jamee JONELLE Shanks Chase, DO     [1]  Allergies Allergen Reactions   Penicillins

## 2024-01-19 ENCOUNTER — Other Ambulatory Visit: Payer: Self-pay | Admitting: Family Medicine

## 2024-01-19 DIAGNOSIS — J0140 Acute pansinusitis, unspecified: Secondary | ICD-10-CM

## 2024-08-06 ENCOUNTER — Encounter: Admitting: Medical
